# Patient Record
Sex: Male | Born: 1955 | Race: White | Hispanic: No | Marital: Single | Smoking: Never smoker
Health system: Southern US, Community
[De-identification: ages and names within clinical notes are randomized; demographics above are authoritative.]

## PROBLEM LIST (undated history)

## (undated) DIAGNOSIS — F32A Depression, unspecified: Secondary | ICD-10-CM

## (undated) DIAGNOSIS — G629 Polyneuropathy, unspecified: Secondary | ICD-10-CM

## (undated) DIAGNOSIS — K7460 Unspecified cirrhosis of liver: Secondary | ICD-10-CM

## (undated) DIAGNOSIS — E7800 Pure hypercholesterolemia, unspecified: Secondary | ICD-10-CM

## (undated) DIAGNOSIS — F1020 Alcohol dependence, uncomplicated: Secondary | ICD-10-CM

## (undated) DIAGNOSIS — E119 Type 2 diabetes mellitus without complications: Secondary | ICD-10-CM

## (undated) DIAGNOSIS — F329 Major depressive disorder, single episode, unspecified: Secondary | ICD-10-CM

## (undated) HISTORY — PX: SHOULDER SURGERY: SHX246

## (undated) HISTORY — PX: OTHER SURGICAL HISTORY: SHX169

## (undated) HISTORY — PX: LUMBAR SPINE SURGERY: SHX701

---

## 2015-08-22 ENCOUNTER — Encounter (HOSPITAL_COMMUNITY)
Admission: EM | Disposition: A | Discharge status: Home / Self Care | Payer: Self-pay | Source: Home / Self Care | Attending: Pulmonary Disease

## 2015-08-22 ENCOUNTER — Inpatient Hospital Stay (HOSPITAL_COMMUNITY)
Admission: EM | Admit: 2015-08-22 | Discharge: 2015-08-29 | DRG: 368 | Disposition: A | Discharge status: Home / Self Care | Payer: Self-pay | Attending: Internal Medicine | Admitting: Internal Medicine

## 2015-08-22 ENCOUNTER — Inpatient Hospital Stay (HOSPITAL_COMMUNITY): Payer: Self-pay

## 2015-08-22 ENCOUNTER — Encounter (HOSPITAL_COMMUNITY): Payer: Self-pay | Admitting: Emergency Medicine

## 2015-08-22 ENCOUNTER — Observation Stay (HOSPITAL_COMMUNITY): Payer: Self-pay

## 2015-08-22 DIAGNOSIS — F329 Major depressive disorder, single episode, unspecified: Secondary | ICD-10-CM | POA: Diagnosis present

## 2015-08-22 DIAGNOSIS — R000 Tachycardia, unspecified: Secondary | ICD-10-CM | POA: Diagnosis present

## 2015-08-22 DIAGNOSIS — D689 Coagulation defect, unspecified: Secondary | ICD-10-CM | POA: Diagnosis present

## 2015-08-22 DIAGNOSIS — K7460 Unspecified cirrhosis of liver: Secondary | ICD-10-CM | POA: Diagnosis present

## 2015-08-22 DIAGNOSIS — E669 Obesity, unspecified: Secondary | ICD-10-CM | POA: Diagnosis present

## 2015-08-22 DIAGNOSIS — R579 Shock, unspecified: Secondary | ICD-10-CM

## 2015-08-22 DIAGNOSIS — F10929 Alcohol use, unspecified with intoxication, unspecified: Secondary | ICD-10-CM

## 2015-08-22 DIAGNOSIS — Z9884 Bariatric surgery status: Secondary | ICD-10-CM

## 2015-08-22 DIAGNOSIS — Z6828 Body mass index (BMI) 28.0-28.9, adult: Secondary | ICD-10-CM

## 2015-08-22 DIAGNOSIS — Z01818 Encounter for other preprocedural examination: Secondary | ICD-10-CM

## 2015-08-22 DIAGNOSIS — Z794 Long term (current) use of insulin: Secondary | ICD-10-CM

## 2015-08-22 DIAGNOSIS — K709 Alcoholic liver disease, unspecified: Secondary | ICD-10-CM | POA: Diagnosis present

## 2015-08-22 DIAGNOSIS — R571 Hypovolemic shock: Secondary | ICD-10-CM | POA: Diagnosis present

## 2015-08-22 DIAGNOSIS — E876 Hypokalemia: Secondary | ICD-10-CM | POA: Diagnosis not present

## 2015-08-22 DIAGNOSIS — N170 Acute kidney failure with tubular necrosis: Secondary | ICD-10-CM | POA: Diagnosis present

## 2015-08-22 DIAGNOSIS — E875 Hyperkalemia: Secondary | ICD-10-CM | POA: Diagnosis present

## 2015-08-22 DIAGNOSIS — J9600 Acute respiratory failure, unspecified whether with hypoxia or hypercapnia: Secondary | ICD-10-CM

## 2015-08-22 DIAGNOSIS — I8500 Esophageal varices without bleeding: Secondary | ICD-10-CM | POA: Diagnosis present

## 2015-08-22 DIAGNOSIS — E872 Acidosis: Secondary | ICD-10-CM | POA: Diagnosis present

## 2015-08-22 DIAGNOSIS — E1310 Other specified diabetes mellitus with ketoacidosis without coma: Secondary | ICD-10-CM | POA: Diagnosis present

## 2015-08-22 DIAGNOSIS — F10239 Alcohol dependence with withdrawal, unspecified: Secondary | ICD-10-CM | POA: Diagnosis present

## 2015-08-22 DIAGNOSIS — R7881 Bacteremia: Secondary | ICD-10-CM

## 2015-08-22 DIAGNOSIS — F10920 Alcohol use, unspecified with intoxication, uncomplicated: Secondary | ICD-10-CM

## 2015-08-22 DIAGNOSIS — F32A Depression, unspecified: Secondary | ICD-10-CM | POA: Diagnosis present

## 2015-08-22 DIAGNOSIS — K922 Gastrointestinal hemorrhage, unspecified: Secondary | ICD-10-CM

## 2015-08-22 DIAGNOSIS — Y908 Blood alcohol level of 240 mg/100 ml or more: Secondary | ICD-10-CM | POA: Diagnosis present

## 2015-08-22 DIAGNOSIS — D62 Acute posthemorrhagic anemia: Secondary | ICD-10-CM | POA: Diagnosis present

## 2015-08-22 DIAGNOSIS — R7401 Elevation of levels of liver transaminase levels: Secondary | ICD-10-CM

## 2015-08-22 DIAGNOSIS — A419 Sepsis, unspecified organism: Secondary | ICD-10-CM

## 2015-08-22 DIAGNOSIS — R7989 Other specified abnormal findings of blood chemistry: Secondary | ICD-10-CM | POA: Diagnosis present

## 2015-08-22 DIAGNOSIS — K226 Gastro-esophageal laceration-hemorrhage syndrome: Principal | ICD-10-CM | POA: Diagnosis present

## 2015-08-22 DIAGNOSIS — E785 Hyperlipidemia, unspecified: Secondary | ICD-10-CM | POA: Diagnosis present

## 2015-08-22 DIAGNOSIS — R188 Other ascites: Secondary | ICD-10-CM | POA: Diagnosis present

## 2015-08-22 DIAGNOSIS — D696 Thrombocytopenia, unspecified: Secondary | ICD-10-CM | POA: Diagnosis present

## 2015-08-22 DIAGNOSIS — D6959 Other secondary thrombocytopenia: Secondary | ICD-10-CM | POA: Diagnosis present

## 2015-08-22 DIAGNOSIS — F1020 Alcohol dependence, uncomplicated: Secondary | ICD-10-CM | POA: Diagnosis present

## 2015-08-22 DIAGNOSIS — R748 Abnormal levels of other serum enzymes: Secondary | ICD-10-CM

## 2015-08-22 DIAGNOSIS — E7800 Pure hypercholesterolemia, unspecified: Secondary | ICD-10-CM

## 2015-08-22 DIAGNOSIS — R578 Other shock: Secondary | ICD-10-CM | POA: Diagnosis present

## 2015-08-22 DIAGNOSIS — E1110 Type 2 diabetes mellitus with ketoacidosis without coma: Secondary | ICD-10-CM

## 2015-08-22 DIAGNOSIS — F10220 Alcohol dependence with intoxication, uncomplicated: Secondary | ICD-10-CM | POA: Diagnosis present

## 2015-08-22 DIAGNOSIS — E119 Type 2 diabetes mellitus without complications: Secondary | ICD-10-CM

## 2015-08-22 DIAGNOSIS — A499 Bacterial infection, unspecified: Secondary | ICD-10-CM

## 2015-08-22 DIAGNOSIS — K7200 Acute and subacute hepatic failure without coma: Secondary | ICD-10-CM | POA: Diagnosis present

## 2015-08-22 DIAGNOSIS — E8770 Fluid overload, unspecified: Secondary | ICD-10-CM

## 2015-08-22 DIAGNOSIS — K766 Portal hypertension: Secondary | ICD-10-CM | POA: Diagnosis present

## 2015-08-22 DIAGNOSIS — K921 Melena: Secondary | ICD-10-CM

## 2015-08-22 DIAGNOSIS — G629 Polyneuropathy, unspecified: Secondary | ICD-10-CM

## 2015-08-22 DIAGNOSIS — Z79899 Other long term (current) drug therapy: Secondary | ICD-10-CM

## 2015-08-22 DIAGNOSIS — R740 Nonspecific elevation of levels of transaminase and lactic acid dehydrogenase [LDH]: Secondary | ICD-10-CM | POA: Diagnosis present

## 2015-08-22 HISTORY — DX: Alcohol dependence, uncomplicated: F10.20

## 2015-08-22 HISTORY — DX: Pure hypercholesterolemia, unspecified: E78.00

## 2015-08-22 HISTORY — DX: Major depressive disorder, single episode, unspecified: F32.9

## 2015-08-22 HISTORY — DX: Polyneuropathy, unspecified: G62.9

## 2015-08-22 HISTORY — DX: Depression, unspecified: F32.A

## 2015-08-22 HISTORY — PX: ESOPHAGOGASTRODUODENOSCOPY: SHX5428

## 2015-08-22 HISTORY — DX: Type 2 diabetes mellitus without complications: E11.9

## 2015-08-22 LAB — POCT I-STAT 3, ART BLOOD GAS (G3+)
ACID-BASE DEFICIT: 19 mmol/L — AB (ref 0.0–2.0)
ACID-BASE DEFICIT: 13 mmol/L — AB (ref 0.0–2.0)
Acid-base deficit: 16 mmol/L — ABNORMAL HIGH (ref 0.0–2.0)
Bicarbonate: 9.3 mEq/L — ABNORMAL LOW (ref 20.0–24.0)
Bicarbonate: 11.5 mEq/L — ABNORMAL LOW (ref 20.0–24.0)
Bicarbonate: 12.6 mEq/L — ABNORMAL LOW (ref 20.0–24.0)
O2 Saturation: 100 %
O2 Saturation: 99 %
O2 Saturation: 99 %
PCO2 ART: 33.5 mmHg — AB (ref 35.0–45.0)
PH ART: 7.145 — AB (ref 7.350–7.450)
PH ART: 7.278 — AB (ref 7.350–7.450)
TCO2: 10 mmol/L (ref 0–100)
TCO2: 13 mmol/L (ref 0–100)
TCO2: 13 mmol/L (ref 0–100)
pCO2 arterial: 30.2 mmHg — ABNORMAL LOW (ref 35.0–45.0)
pCO2 arterial: 27.2 mmHg — ABNORMAL LOW (ref 35.0–45.0)
pH, Arterial: 7.096 — CL (ref 7.350–7.450)
pO2, Arterial: 249 mmHg — ABNORMAL HIGH (ref 80.0–100.0)
pO2, Arterial: 165 mmHg — ABNORMAL HIGH (ref 80.0–100.0)
pO2, Arterial: 177 mmHg — ABNORMAL HIGH (ref 80.0–100.0)

## 2015-08-22 LAB — CBC
HCT: 20 % — ABNORMAL LOW (ref 39.0–52.0)
HCT: 23.3 % — ABNORMAL LOW (ref 39.0–52.0)
HCT: 28.8 % — ABNORMAL LOW (ref 39.0–52.0)
HEMATOCRIT: 28.3 % — AB (ref 39.0–52.0)
HEMATOCRIT: 25.6 % — AB (ref 39.0–52.0)
HEMOGLOBIN: 6.3 g/dL — AB (ref 13.0–17.0)
HEMOGLOBIN: 7.9 g/dL — AB (ref 13.0–17.0)
Hemoglobin: 7.1 g/dL — ABNORMAL LOW (ref 13.0–17.0)
Hemoglobin: 8.7 g/dL — ABNORMAL LOW (ref 13.0–17.0)
Hemoglobin: 8.7 g/dL — ABNORMAL LOW (ref 13.0–17.0)
MCH: 27.5 pg (ref 26.0–34.0)
MCH: 27.3 pg (ref 26.0–34.0)
MCH: 27.3 pg (ref 26.0–34.0)
MCH: 27.6 pg (ref 26.0–34.0)
MCH: 27.1 pg (ref 26.0–34.0)
MCHC: 31.5 g/dL (ref 30.0–36.0)
MCHC: 30.5 g/dL (ref 30.0–36.0)
MCHC: 30.2 g/dL (ref 30.0–36.0)
MCHC: 30.7 g/dL (ref 30.0–36.0)
MCHC: 30.9 g/dL (ref 30.0–36.0)
MCV: 87.3 fL (ref 78.0–100.0)
MCV: 89.6 fL (ref 78.0–100.0)
MCV: 90.3 fL (ref 78.0–100.0)
MCV: 89.8 fL (ref 78.0–100.0)
MCV: 87.7 fL (ref 78.0–100.0)
PLATELETS: 73 10*3/uL — AB (ref 150–400)
PLATELETS: 69 10*3/uL — AB (ref 150–400)
PLATELETS: 79 10*3/uL — AB (ref 150–400)
PLATELETS: 80 10*3/uL — AB (ref 150–400)
Platelets: 74 10*3/uL — ABNORMAL LOW (ref 150–400)
RBC: 2.29 MIL/uL — AB (ref 4.22–5.81)
RBC: 2.6 MIL/uL — ABNORMAL LOW (ref 4.22–5.81)
RBC: 3.19 MIL/uL — AB (ref 4.22–5.81)
RBC: 3.15 MIL/uL — AB (ref 4.22–5.81)
RBC: 2.92 MIL/uL — ABNORMAL LOW (ref 4.22–5.81)
RDW: 17.7 % — ABNORMAL HIGH (ref 11.5–15.5)
RDW: 16.8 % — AB (ref 11.5–15.5)
RDW: 15.6 % — ABNORMAL HIGH (ref 11.5–15.5)
RDW: 16.1 % — AB (ref 11.5–15.5)
RDW: 16.5 % — ABNORMAL HIGH (ref 11.5–15.5)
WBC: 5.1 10*3/uL (ref 4.0–10.5)
WBC: 7.3 10*3/uL (ref 4.0–10.5)
WBC: 9.3 10*3/uL (ref 4.0–10.5)
WBC: 14.2 10*3/uL — AB (ref 4.0–10.5)
WBC: 13.1 10*3/uL — AB (ref 4.0–10.5)

## 2015-08-22 LAB — COMPREHENSIVE METABOLIC PANEL
ALK PHOS: 107 U/L (ref 38–126)
ALK PHOS: 84 U/L (ref 38–126)
ALT: 93 U/L — ABNORMAL HIGH (ref 17–63)
ALT: 114 U/L — ABNORMAL HIGH (ref 17–63)
ALT: 282 U/L — AB (ref 17–63)
ANION GAP: 22 — AB (ref 5–15)
ANION GAP: 22 — AB (ref 5–15)
AST: 240 U/L — ABNORMAL HIGH (ref 15–41)
AST: 304 U/L — AB (ref 15–41)
AST: 763 U/L — AB (ref 15–41)
Albumin: 3.5 g/dL (ref 3.5–5.0)
Albumin: 3.4 g/dL — ABNORMAL LOW (ref 3.5–5.0)
Albumin: 2.9 g/dL — ABNORMAL LOW (ref 3.5–5.0)
Alkaline Phosphatase: 99 U/L (ref 38–126)
Anion gap: 26 — ABNORMAL HIGH (ref 5–15)
BILIRUBIN TOTAL: 2.5 mg/dL — AB (ref 0.3–1.2)
BILIRUBIN TOTAL: 2.6 mg/dL — AB (ref 0.3–1.2)
BUN: 15 mg/dL (ref 6–20)
BUN: 15 mg/dL (ref 6–20)
BUN: 17 mg/dL (ref 6–20)
CALCIUM: 8.3 mg/dL — AB (ref 8.9–10.3)
CALCIUM: 7.2 mg/dL — AB (ref 8.9–10.3)
CHLORIDE: 102 mmol/L (ref 101–111)
CO2: 19 mmol/L — ABNORMAL LOW (ref 22–32)
CO2: 15 mmol/L — AB (ref 22–32)
CO2: 12 mmol/L — AB (ref 22–32)
Calcium: 8.1 mg/dL — ABNORMAL LOW (ref 8.9–10.3)
Chloride: 101 mmol/L (ref 101–111)
Chloride: 110 mmol/L (ref 101–111)
Creatinine, Ser: 1.13 mg/dL (ref 0.61–1.24)
Creatinine, Ser: 1.03 mg/dL (ref 0.61–1.24)
Creatinine, Ser: 1.02 mg/dL (ref 0.61–1.24)
GFR calc Af Amer: >60 mL/min (ref >60)
GLUCOSE: 166 mg/dL — AB (ref 65–99)
Glucose, Bld: 149 mg/dL — ABNORMAL HIGH (ref 65–99)
Glucose, Bld: 163 mg/dL — ABNORMAL HIGH (ref 65–99)
POTASSIUM: 4.5 mmol/L (ref 3.5–5.1)
POTASSIUM: 4.6 mmol/L (ref 3.5–5.1)
Potassium: 5.6 mmol/L — ABNORMAL HIGH (ref 3.5–5.1)
SODIUM: 143 mmol/L (ref 135–145)
Sodium: 142 mmol/L (ref 135–145)
Sodium: 144 mmol/L (ref 135–145)
TOTAL PROTEIN: 6.5 g/dL (ref 6.5–8.1)
Total Bilirubin: 2.4 mg/dL — ABNORMAL HIGH (ref 0.3–1.2)
Total Protein: 6.3 g/dL — ABNORMAL LOW (ref 6.5–8.1)
Total Protein: 5.6 g/dL — ABNORMAL LOW (ref 6.5–8.1)

## 2015-08-22 LAB — BASIC METABOLIC PANEL
Anion gap: 20 — ABNORMAL HIGH (ref 5–15)
BUN: 17 mg/dL (ref 6–20)
CHLORIDE: 111 mmol/L (ref 101–111)
CO2: 14 mmol/L — AB (ref 22–32)
Calcium: 7.2 mg/dL — ABNORMAL LOW (ref 8.9–10.3)
Creatinine, Ser: 0.99 mg/dL (ref 0.61–1.24)
GFR calc Af Amer: >60 mL/min (ref >60)
GFR calc non Af Amer: >60 mL/min (ref >60)
GLUCOSE: 161 mg/dL — AB (ref 65–99)
POTASSIUM: 5.7 mmol/L — AB (ref 3.5–5.1)
Sodium: 145 mmol/L (ref 135–145)

## 2015-08-22 LAB — I-STAT CG4 LACTIC ACID, ED: LACTIC ACID, VENOUS: 8 mmol/L — AB (ref 0.5–1.9)

## 2015-08-22 LAB — PATHOLOGIST SMEAR REVIEW

## 2015-08-22 LAB — CBC WITH DIFFERENTIAL/PLATELET
BASOS PCT: 0 %
Basophils Absolute: 0 10*3/uL (ref 0.0–0.1)
Basophils Absolute: 0 10*3/uL (ref 0.0–0.1)
Basophils Relative: 1 %
EOS PCT: 0 %
EOS PCT: 0 %
Eosinophils Absolute: 0 10*3/uL (ref 0.0–0.7)
Eosinophils Absolute: 0 10*3/uL (ref 0.0–0.7)
HEMATOCRIT: 25.7 % — AB (ref 39.0–52.0)
HEMATOCRIT: 19.3 % — AB (ref 39.0–52.0)
HEMOGLOBIN: 8.1 g/dL — AB (ref 13.0–17.0)
HEMOGLOBIN: 5.8 g/dL — AB (ref 13.0–17.0)
LYMPHS ABS: 1.2 10*3/uL (ref 0.7–4.0)
LYMPHS PCT: 11 %
Lymphocytes Relative: 28 %
Lymphs Abs: 0.5 10*3/uL — ABNORMAL LOW (ref 0.7–4.0)
MCH: 27.3 pg (ref 26.0–34.0)
MCH: 26.7 pg (ref 26.0–34.0)
MCHC: 31.5 g/dL (ref 30.0–36.0)
MCHC: 30.1 g/dL (ref 30.0–36.0)
MCV: 86.5 fL (ref 78.0–100.0)
MCV: 88.9 fL (ref 78.0–100.0)
MONO ABS: 0.3 10*3/uL (ref 0.1–1.0)
MONOS PCT: 7 %
MONOS PCT: 10 %
Monocytes Absolute: 0.5 10*3/uL (ref 0.1–1.0)
NEUTROS ABS: 2.8 10*3/uL (ref 1.7–7.7)
NEUTROS PCT: 79 %
Neutro Abs: 3.5 10*3/uL (ref 1.7–7.7)
Neutrophils Relative %: 64 %
Platelets: 73 10*3/uL — ABNORMAL LOW (ref 150–400)
Platelets: 61 10*3/uL — ABNORMAL LOW (ref 150–400)
RBC: 2.97 MIL/uL — AB (ref 4.22–5.81)
RBC: 2.17 MIL/uL — AB (ref 4.22–5.81)
RDW: 17.5 % — AB (ref 11.5–15.5)
RDW: 17.7 % — ABNORMAL HIGH (ref 11.5–15.5)
WBC: 4.3 10*3/uL (ref 4.0–10.5)
WBC: 4.5 10*3/uL (ref 4.0–10.5)

## 2015-08-22 LAB — BLOOD CULTURE ID PANEL (REFLEXED)
ACINETOBACTER BAUMANNII: NOT DETECTED
CANDIDA KRUSEI: NOT DETECTED
CARBAPENEM RESISTANCE: NOT DETECTED
Candida albicans: NOT DETECTED
Candida glabrata: NOT DETECTED
Candida parapsilosis: NOT DETECTED
Candida tropicalis: NOT DETECTED
ENTEROBACTERIACEAE SPECIES: DETECTED — AB
ESCHERICHIA COLI: DETECTED — AB
Enterobacter cloacae complex: NOT DETECTED
Enterococcus species: DETECTED — AB
Haemophilus influenzae: NOT DETECTED
KLEBSIELLA OXYTOCA: NOT DETECTED
Klebsiella pneumoniae: DETECTED — AB
Listeria monocytogenes: NOT DETECTED
Methicillin resistance: NOT DETECTED
NEISSERIA MENINGITIDIS: NOT DETECTED
PSEUDOMONAS AERUGINOSA: NOT DETECTED
Proteus species: NOT DETECTED
STAPHYLOCOCCUS AUREUS BCID: NOT DETECTED
STAPHYLOCOCCUS SPECIES: NOT DETECTED
STREPTOCOCCUS AGALACTIAE: NOT DETECTED
STREPTOCOCCUS SPECIES: NOT DETECTED
Serratia marcescens: NOT DETECTED
Streptococcus pneumoniae: NOT DETECTED
Streptococcus pyogenes: NOT DETECTED
Vancomycin resistance: NOT DETECTED

## 2015-08-22 LAB — GLUCOSE, CAPILLARY
GLUCOSE-CAPILLARY: 142 mg/dL — AB (ref 65–99)
GLUCOSE-CAPILLARY: 151 mg/dL — AB (ref 65–99)
Glucose-Capillary: 189 mg/dL — ABNORMAL HIGH (ref 65–99)
Glucose-Capillary: 202 mg/dL — ABNORMAL HIGH (ref 65–99)

## 2015-08-22 LAB — PREPARE RBC (CROSSMATCH)

## 2015-08-22 LAB — LACTIC ACID, PLASMA
LACTIC ACID, VENOUS: 8.8 mmol/L — AB (ref 0.5–1.9)
LACTIC ACID, VENOUS: 9.8 mmol/L — AB (ref 0.5–1.9)
Lactic Acid, Venous: 9.6 mmol/L (ref 0.5–1.9)

## 2015-08-22 LAB — TRANSFUSION REACTION
DAT C3: NEGATIVE
POST RXN DAT IGG: NEGATIVE

## 2015-08-22 LAB — PROTIME-INR
INR: 1.36
INR: 1.8
PROTHROMBIN TIME: 16.9 s — AB (ref 11.4–15.2)
Prothrombin Time: 21.1 seconds — ABNORMAL HIGH (ref 11.4–15.2)

## 2015-08-22 LAB — ETHANOL: ALCOHOL ETHYL (B): 316 mg/dL — AB (ref <5)

## 2015-08-22 LAB — PHOSPHORUS: PHOSPHORUS: 5.4 mg/dL — AB (ref 2.5–4.6)

## 2015-08-22 LAB — CORTISOL: Cortisol, Plasma: 37.2 ug/dL

## 2015-08-22 LAB — ABO/RH: ABO/RH(D): O POS

## 2015-08-22 LAB — PROCALCITONIN: PROCALCITONIN: 0.18 ng/mL

## 2015-08-22 LAB — MRSA PCR SCREENING: MRSA by PCR: NEGATIVE

## 2015-08-22 LAB — MAGNESIUM: MAGNESIUM: 1.5 mg/dL — AB (ref 1.7–2.4)

## 2015-08-22 LAB — APTT: APTT: 28 s (ref 24–36)

## 2015-08-22 LAB — LIPASE, BLOOD: Lipase: 42 U/L (ref 11–51)

## 2015-08-22 LAB — AMYLASE: AMYLASE: 38 U/L (ref 28–100)

## 2015-08-22 LAB — TROPONIN I: Troponin I: <0.03 ng/mL (ref <0.03)

## 2015-08-22 LAB — D-DIMER, QUANTITATIVE (NOT AT ARMC): D DIMER QUANT: 3.29 ug{FEU}/mL — AB (ref 0.00–0.50)

## 2015-08-22 LAB — SAMPLE TO BLOOD BANK

## 2015-08-22 LAB — BRAIN NATRIURETIC PEPTIDE: B Natriuretic Peptide: 14.7 pg/mL (ref 0.0–100.0)

## 2015-08-22 SURGERY — EGD (ESOPHAGOGASTRODUODENOSCOPY)
Anesthesia: Moderate Sedation | Laterality: Left

## 2015-08-22 MED ORDER — SODIUM CHLORIDE 0.9 % IV SOLN: 8.0000 mg/h | INTRAVENOUS | Status: DC

## 2015-08-22 MED ORDER — SODIUM CHLORIDE 0.9 % IV SOLN: Freq: Once | INTRAVENOUS | Status: DC

## 2015-08-22 MED ORDER — VITAMIN B-1 100 MG PO TABS
100.0000 mg | ORAL_TABLET | Freq: Every day | ORAL | Status: DC
  Administered 2015-08-25 – 2015-08-29 (×5): 100 mg via ORAL
  Filled 2015-08-22 (×9): qty 1

## 2015-08-22 MED ORDER — MIDAZOLAM HCL 2 MG/2ML IJ SOLN
2.0000 mg | INTRAMUSCULAR | Status: DC | PRN
  Administered 2015-08-22 – 2015-08-23 (×6): 2 mg via INTRAVENOUS
  Filled 2015-08-22 (×8): qty 2

## 2015-08-22 MED ORDER — NOREPINEPHRINE BITARTRATE 1 MG/ML IV SOLN
2.0000 ug/min | INTRAVENOUS | Status: DC
  Administered 2015-08-22: 45 ug/min via INTRAVENOUS
  Administered 2015-08-22: 5 ug/min via INTRAVENOUS
  Administered 2015-08-23: 30 ug/min via INTRAVENOUS
  Administered 2015-08-23: 35 ug/min via INTRAVENOUS
  Filled 2015-08-22 (×5): qty 16

## 2015-08-22 MED ORDER — FENTANYL CITRATE (PF) 100 MCG/2ML IJ SOLN
INTRAMUSCULAR | Status: AC
  Administered 2015-08-22: 100 ug
  Filled 2015-08-22: qty 4

## 2015-08-22 MED ORDER — METOCLOPRAMIDE HCL 5 MG/ML IJ SOLN
10.0000 mg | Freq: Once | INTRAMUSCULAR | Status: AC
  Administered 2015-08-22: 10 mg via INTRAVENOUS
  Filled 2015-08-22: qty 2

## 2015-08-22 MED ORDER — LORAZEPAM 2 MG/ML IJ SOLN: 0.0000 mg | Freq: Two times a day (BID) | INTRAMUSCULAR | Status: DC

## 2015-08-22 MED ORDER — SIMVASTATIN 20 MG PO TABS: 20.0000 mg | ORAL_TABLET | Freq: Every day | ORAL | Status: DC

## 2015-08-22 MED ORDER — INSULIN ASPART PROT & ASPART (70-30 MIX) 100 UNIT/ML ~~LOC~~ SUSP: 6.0000 [IU] | Freq: Two times a day (BID) | SUBCUTANEOUS | Status: DC

## 2015-08-22 MED ORDER — SODIUM CHLORIDE 0.9 % IV SOLN
INTRAVENOUS | Status: DC
  Administered 2015-08-22: 11:00:00 via INTRAVENOUS

## 2015-08-22 MED ORDER — SODIUM CHLORIDE 0.9% FLUSH
3.0000 mL | Freq: Two times a day (BID) | INTRAVENOUS | Status: DC
  Administered 2015-08-23 – 2015-08-29 (×10): 3 mL via INTRAVENOUS

## 2015-08-22 MED ORDER — SODIUM BICARBONATE 8.4 % IV SOLN
50.0000 meq | Freq: Once | INTRAVENOUS | Status: AC
  Administered 2015-08-22: 50 meq via INTRAVENOUS
  Filled 2015-08-22: qty 50

## 2015-08-22 MED ORDER — SODIUM CHLORIDE 0.9 % IV SOLN
80.0000 mg | Freq: Once | INTRAVENOUS | Status: AC
  Administered 2015-08-22: 80 mg via INTRAVENOUS
  Filled 2015-08-22: qty 80

## 2015-08-22 MED ORDER — BUSPIRONE HCL 5 MG PO TABS
5.0000 mg | ORAL_TABLET | Freq: Two times a day (BID) | ORAL | Status: DC
  Filled 2015-08-22: qty 1

## 2015-08-22 MED ORDER — VITAMIN K1 10 MG/ML IJ SOLN
10.0000 mg | Freq: Once | INTRAMUSCULAR | Status: AC
  Administered 2015-08-22: 10 mg via INTRAVENOUS
  Filled 2015-08-22: qty 1

## 2015-08-22 MED ORDER — SODIUM CHLORIDE 0.9 % IV SOLN: 80.0000 mg | Freq: Once | INTRAVENOUS | Status: DC

## 2015-08-22 MED ORDER — SODIUM CHLORIDE 0.9 % IV SOLN: INTRAVENOUS | Status: DC

## 2015-08-22 MED ORDER — SODIUM CHLORIDE 0.9 % IV SOLN
1000.0000 mL | INTRAVENOUS | Status: DC
  Administered 2015-08-22: 1000 mL via INTRAVENOUS

## 2015-08-22 MED ORDER — SODIUM CHLORIDE 0.9 % IJ SOLN
INTRAMUSCULAR | Status: DC | PRN
  Administered 2015-08-22: 4 mL

## 2015-08-22 MED ORDER — OCTREOTIDE LOAD VIA INFUSION: 50.0000 ug | Freq: Once | INTRAVENOUS | Status: DC

## 2015-08-22 MED ORDER — FENTANYL BOLUS VIA INFUSION
50.0000 ug | INTRAVENOUS | Status: DC | PRN
  Filled 2015-08-22: qty 50

## 2015-08-22 MED ORDER — THIAMINE HCL 100 MG/ML IJ SOLN
100.0000 mg | Freq: Every day | INTRAMUSCULAR | Status: DC
  Administered 2015-08-22 – 2015-08-24 (×3): 100 mg via INTRAVENOUS
  Filled 2015-08-22 (×4): qty 2

## 2015-08-22 MED ORDER — MIDAZOLAM HCL 2 MG/2ML IJ SOLN
INTRAMUSCULAR | Status: AC
  Administered 2015-08-22: 2 mg via INTRAVENOUS
  Filled 2015-08-22: qty 2

## 2015-08-22 MED ORDER — NOREPINEPHRINE BITARTRATE 1 MG/ML IV SOLN: 2.0000 ug/min | INTRAVENOUS | Status: DC

## 2015-08-22 MED ORDER — ONDANSETRON HCL 4 MG/2ML IJ SOLN: 4.0000 mg | Freq: Four times a day (QID) | INTRAMUSCULAR | Status: DC | PRN

## 2015-08-22 MED ORDER — EPINEPHRINE HCL 0.1 MG/ML IJ SOSY
PREFILLED_SYRINGE | INTRAMUSCULAR | Status: AC
  Filled 2015-08-22: qty 20

## 2015-08-22 MED ORDER — CHLORHEXIDINE GLUCONATE 0.12% ORAL RINSE (MEDLINE KIT)
15.0000 mL | Freq: Two times a day (BID) | OROMUCOSAL | Status: DC
  Administered 2015-08-22 – 2015-08-25 (×6): 15 mL via OROMUCOSAL

## 2015-08-22 MED ORDER — INSULIN ASPART 100 UNIT/ML ~~LOC~~ SOLN: 0.0000 [IU] | Freq: Three times a day (TID) | SUBCUTANEOUS | Status: DC

## 2015-08-22 MED ORDER — SODIUM CHLORIDE 0.9 % IV SOLN
50.0000 ug/h | INTRAVENOUS | Status: DC
  Administered 2015-08-22 – 2015-08-24 (×6): 50 ug/h via INTRAVENOUS
  Filled 2015-08-22 (×9): qty 1

## 2015-08-22 MED ORDER — PANTOPRAZOLE SODIUM 40 MG IV SOLR
40.0000 mg | Freq: Two times a day (BID) | INTRAVENOUS | Status: DC
  Administered 2015-08-25 – 2015-08-29 (×9): 40 mg via INTRAVENOUS
  Filled 2015-08-22 (×10): qty 40

## 2015-08-22 MED ORDER — PANTOPRAZOLE SODIUM 40 MG IV SOLR: 40.0000 mg | Freq: Two times a day (BID) | INTRAVENOUS | Status: DC

## 2015-08-22 MED ORDER — SODIUM CHLORIDE 0.9 % IV BOLUS (SEPSIS)
1000.0000 mL | Freq: Once | INTRAVENOUS | Status: AC
  Administered 2015-08-22: 1000 mL via INTRAVENOUS

## 2015-08-22 MED ORDER — SODIUM CHLORIDE 0.9 % IV SOLN: 250.0000 mL | INTRAVENOUS | Status: DC | PRN

## 2015-08-22 MED ORDER — CEFTRIAXONE SODIUM 2 G IJ SOLR
2.0000 g | INTRAMUSCULAR | Status: DC
  Filled 2015-08-22: qty 2

## 2015-08-22 MED ORDER — CITALOPRAM HYDROBROMIDE 20 MG PO TABS
20.0000 mg | ORAL_TABLET | Freq: Every day | ORAL | Status: DC
  Filled 2015-08-22: qty 1

## 2015-08-22 MED ORDER — LORAZEPAM 2 MG/ML IJ SOLN
0.0000 mg | INTRAMUSCULAR | Status: DC
  Administered 2015-08-22: 2 mg via INTRAVENOUS
  Filled 2015-08-22: qty 1

## 2015-08-22 MED ORDER — ANTISEPTIC ORAL RINSE SOLUTION (CORINZ)
7.0000 mL | Freq: Four times a day (QID) | OROMUCOSAL | Status: DC
  Administered 2015-08-22 – 2015-08-24 (×10): 7 mL via OROMUCOSAL

## 2015-08-22 MED ORDER — FENTANYL CITRATE (PF) 100 MCG/2ML IJ SOLN: 50.0000 ug | Freq: Once | INTRAMUSCULAR | Status: DC

## 2015-08-22 MED ORDER — ONDANSETRON HCL 4 MG PO TABS: 4.0000 mg | ORAL_TABLET | Freq: Four times a day (QID) | ORAL | Status: DC | PRN

## 2015-08-22 MED ORDER — LORAZEPAM 2 MG/ML IJ SOLN
0.0000 mg | Freq: Four times a day (QID) | INTRAMUSCULAR | Status: DC
  Administered 2015-08-22: 1 mg via INTRAVENOUS
  Filled 2015-08-22: qty 1

## 2015-08-22 MED ORDER — ONDANSETRON HCL 4 MG/2ML IJ SOLN
4.0000 mg | Freq: Once | INTRAMUSCULAR | Status: AC
  Administered 2015-08-22: 4 mg via INTRAVENOUS
  Filled 2015-08-22: qty 2

## 2015-08-22 MED ORDER — STERILE WATER FOR INJECTION IV SOLN
INTRAVENOUS | Status: DC
  Administered 2015-08-22 (×2): via INTRAVENOUS
  Filled 2015-08-22 (×5): qty 850

## 2015-08-22 MED ORDER — INSULIN ASPART 100 UNIT/ML ~~LOC~~ SOLN
0.0000 [IU] | SUBCUTANEOUS | Status: DC
  Administered 2015-08-22: 3 [IU] via SUBCUTANEOUS
  Administered 2015-08-22: 5 [IU] via SUBCUTANEOUS
  Administered 2015-08-23: 3 [IU] via SUBCUTANEOUS
  Administered 2015-08-23 (×2): 5 [IU] via SUBCUTANEOUS
  Administered 2015-08-23 (×2): 3 [IU] via SUBCUTANEOUS
  Administered 2015-08-23: 5 [IU] via SUBCUTANEOUS
  Administered 2015-08-24 (×2): 3 [IU] via SUBCUTANEOUS

## 2015-08-22 MED ORDER — DEXTROSE 5 % IV SOLN: 2.0000 g | INTRAVENOUS | Status: DC

## 2015-08-22 MED ORDER — VANCOMYCIN HCL 10 G IV SOLR
2000.0000 mg | Freq: Once | INTRAVENOUS | Status: AC
  Administered 2015-08-23: 2000 mg via INTRAVENOUS
  Filled 2015-08-22: qty 2000

## 2015-08-22 MED ORDER — SODIUM CHLORIDE 0.9 % IV SOLN
25.0000 ug/h | INTRAVENOUS | Status: DC
  Filled 2015-08-22: qty 1

## 2015-08-22 MED ORDER — GABAPENTIN 300 MG PO CAPS
300.0000 mg | ORAL_CAPSULE | Freq: Three times a day (TID) | ORAL | Status: DC
  Filled 2015-08-22: qty 1

## 2015-08-22 MED ORDER — VANCOMYCIN HCL IN DEXTROSE 1-5 GM/200ML-% IV SOLN
1000.0000 mg | Freq: Three times a day (TID) | INTRAVENOUS | Status: DC
  Administered 2015-08-23: 1000 mg via INTRAVENOUS
  Filled 2015-08-22 (×2): qty 200

## 2015-08-22 MED ORDER — MIDAZOLAM HCL 2 MG/2ML IJ SOLN
2.0000 mg | INTRAMUSCULAR | Status: DC | PRN
  Administered 2015-08-22 (×2): 2 mg via INTRAVENOUS
  Filled 2015-08-22: qty 2

## 2015-08-22 MED ORDER — SODIUM CHLORIDE 0.9 % IV SOLN
1000.0000 mL | Freq: Once | INTRAVENOUS | Status: AC
  Administered 2015-08-22: 1000 mL via INTRAVENOUS

## 2015-08-22 MED ORDER — PANTOPRAZOLE SODIUM 40 MG IV SOLR
8.0000 mg/h | INTRAVENOUS | Status: AC
  Administered 2015-08-22 – 2015-08-25 (×7): 8 mg/h via INTRAVENOUS
  Filled 2015-08-22 (×14): qty 80

## 2015-08-22 MED ORDER — FOLIC ACID 5 MG/ML IJ SOLN
1.0000 mg | Freq: Every day | INTRAMUSCULAR | Status: DC
  Administered 2015-08-22 – 2015-08-24 (×3): 1 mg via INTRAVENOUS
  Filled 2015-08-22 (×6): qty 0.2

## 2015-08-22 MED ORDER — VASOPRESSIN 20 UNIT/ML IV SOLN
0.0300 [IU]/min | INTRAVENOUS | Status: DC
  Administered 2015-08-22 – 2015-08-23 (×2): 0.03 [IU]/min via INTRAVENOUS
  Filled 2015-08-22 (×2): qty 2

## 2015-08-22 MED ORDER — FENTANYL CITRATE (PF) 2500 MCG/50ML IJ SOLN
25.0000 ug/h | INTRAMUSCULAR | Status: DC
  Administered 2015-08-22: 250 ug/h via INTRAVENOUS
  Administered 2015-08-22 – 2015-08-23 (×3): 350 ug/h via INTRAVENOUS
  Administered 2015-08-23: 100 ug/h via INTRAVENOUS
  Administered 2015-08-24: 200 ug/h via INTRAVENOUS
  Filled 2015-08-22 (×6): qty 50

## 2015-08-22 MED ORDER — DEXTROSE 5 % IV SOLN
2.0000 g | INTRAVENOUS | Status: AC
  Administered 2015-08-23: 2 g via INTRAVENOUS
  Filled 2015-08-22: qty 2

## 2015-08-22 MED ORDER — OCTREOTIDE LOAD VIA INFUSION
50.0000 ug | Freq: Once | INTRAVENOUS | Status: AC
  Administered 2015-08-22: 50 ug via INTRAVENOUS
  Filled 2015-08-22: qty 25

## 2015-08-22 NOTE — Progress Notes (Signed)
ABG off schedule due to patient not being intubated until 1045. I came to obtain ABG at 1200, but patient in procedure. Will obtain when then are done.

## 2015-08-22 NOTE — Op Note (Signed)
[facility] Patient Name: Bradley Harris Procedure Date : 08/22/2015 MRN: [id number] Attending MD: Shirley Friar , MD Date of Birth: 05/23/1955 CSN: [id number] Age: 60 Admit Type: Inpatient Procedure:                Upper GI endoscopy Indications:               Providers:                Shirley Friar, MD, Michel Bickers, RN,                            Kandice Robinsons, Technician Referring MD:              Medicines:                 Complications:             Estimated Blood Loss:      Procedure:                After obtaining informed consent, the endoscope was                            passed under direct vision. Throughout the                            procedure, the patient's blood pressure, pulse, and                            oxygen saturations were monitored continuously. The                            was introduced through the mouth, and advanced to                            the. Scope In: Scope Out: Findings:                  Impression:                Recommendation:            Procedure Code(s):         Diagnosis Code(s):         CPT copyright 2016 American Medical Association. All rights reserved. The codes documented in this report are preliminary and upon coder review may  be revised to meet current compliance requirements. Shirley Friar, MD Number of Addenda: 0

## 2015-08-22 NOTE — Brief Op Note (Signed)
Actively bleeding Mallory-Weiss tear with large amount of blood in gastric pouch. Large esophageal varices seen without bleeding stigmata. Hemostasis achieved with bicap gold probe cautery. If rebleeding occurs, then needs IR for embolization or surgery. D/W Dr. Corliss Skains and Dr. Tyson Alias. Continue Octreotide drip and Protonix drip. Keep intubated for airway protection.

## 2015-08-22 NOTE — Care Management Note (Signed)
Case Management Note  Patient Details  Name: Bradley Harris MRN: [id number] Date of Birth: [birth year]  Subjective/Objective:   Pt admitted with GIB                 Action/Plan:  PTA from home.  CSW consulted for current substance abuse.  Pt is now ventilated   Expected Discharge Date:                  Expected Discharge Plan:  Home/Self Care  In-House Referral:  Clinical Social Work  Discharge planning Services     Post Acute Care Choice:    Choice offered to:     DME Arranged:    DME Agency:     HH Arranged:    HH Agency:     Status of Service:  In process, will continue to follow  If discussed at Long Length of Stay Meetings, dates discussed:    Additional Comments:  Cherylann Parr, RN 08/22/2015, 11:21 AM

## 2015-08-22 NOTE — Progress Notes (Signed)
eLink Physician-Brief Progress Note Patient Name: Bradley Harris DOB: 1955-03-30 MRN: [id number]   Date of Service  08/22/2015  HPI/Events of Note  ABG    Component Value Date/Time   PHART 7.145 (LL) 08/22/2015 1828   PCO2ART 33.5 (L) 08/22/2015 1828   PO2ART 165.0 (H) 08/22/2015 1828   HCO3 11.5 (L) 08/22/2015 1828   TCO2 13 08/22/2015 1828   ACIDBASEDEF 16.0 (H) 08/22/2015 1828   O2SAT 99.0 08/22/2015 1828    Already on HCO3 gtt.   eICU Interventions  Increase RR from 18 to 24 and f/u ABG.      Intervention Category Major Interventions: Other:  Verita Kuroda 08/22/2015, 6:35 PM

## 2015-08-22 NOTE — ED Notes (Signed)
Report given to Lindsey, RN.

## 2015-08-22 NOTE — Progress Notes (Signed)
CRITICAL VALUE ALERT  Critical value received:  Lactic Acid 9.6  Date of notification:  08/22/15  Time of notification:  0945  Critical value read back:Yes.    Nurse who received alert:  Lorin Picket  MD notified (1st page):  Vassie Loll  Time of first page:  0945  MD notified (2nd page):  Time of second page:  Responding MD:  Vassie Loll  Time MD responded:  [phone]

## 2015-08-22 NOTE — Progress Notes (Signed)
CRITICAL VALUE ALERT  Critical value received:  Lactic acid9.8  Date of notification:  08/22/15  Time of notification:  1439  Critical value read back:Yes.    Nurse who received alert:  Lorin Picket  MD notified (1st page):  Anders Simmonds  Time of first page:  1455  MD notified (2nd page):  Time of second page:  Responding MD:  Tanja Port  Time MD responded:  1455

## 2015-08-22 NOTE — Progress Notes (Signed)
2 units of PRBC transfused emergently during patients endoscopy do to active bleeding. 1st unit: started at 1230; ended at 1252 2nd unit: Started at 1253; ended at 1318 No complications. Will continue to monitor closely

## 2015-08-22 NOTE — Progress Notes (Signed)
PULMONARY / CRITICAL CARE MEDICINE   Name: Bradley Harris MRN: [id number] DOB: 01/14/1956    ADMISSION DATE:  08/22/2015 CONSULTATION DATE:  08/22/15  REFERRING MD:  Bradley Harris  CHIEF COMPLAINT:  Upper GI bleed  HISTORY OF PRESENT ILLNESS:   Mr. Bradley Harris is a 60 year old with history of alcoholism, diabetes mellitus, depression, hyperlipidemia. He is admitted today with hematemesis and melena for the past 2 days. He drinks a  bottle of bourbon every day. In the ED he was found to have tachycardia, hypertension which improved after 2 L normal saline. He has H/O ascites that was tapped a few years ago. No known varices, he has never had a EGD.  ->He was initially admitted to [facility] and [facility]. However while getting his first unit of blood he stood up and became tachycardic to the 160s. He continues to have hematemesis and melena. PCCM called for admission due to hemodynamic instability. SUBJECTIVE:  Feels sleepy, just got ativan. He is now hypotensive.  VITAL SIGNS: BP (!) 67/42   Pulse 117   Temp 98.5 F (36.9 C) (Oral)   Resp 18   Ht 6\' 4"  (1.93 m)   Wt 230 lb (104.3 kg)   SpO2 98%   BMI 28.00 kg/m   HEMODYNAMICS:   VENTILATOR SETTINGS:   INTAKE / OUTPUT: No intake/output data recorded.  PHYSICAL EXAMINATION: General: sedated, SBP in 60s.  Neuro: arousable, no focal def  HEENT:  Moist mucous membranes pale, no thyromegaly, JVD Cardiovascular:  Regular rate and rhythm, no murmurs rubs gallops Lungs:  Clear no wheezing, crackles, no accessory use  Abdomen:  Distended, soft, positive bowel sounds, no guarding, rigidity Musculoskeletal:  Normal tone and bulk Skin:  Intact, no rash.  LABS:  BMET  Recent Labs Lab 08/22/15 0147 08/22/15 0608 08/22/15 0742  NA 142 143 144  K 4.5 4.6 5.6*  CL 101 102 110  CO2 19* 15* 12*  BUN 15 15 17   CREATININE 1.13 1.03 1.02  GLUCOSE 166* 149* 163*    Electrolytes  Recent Labs Lab 08/22/15 0147 08/22/15 0608 08/22/15 0742   CALCIUM 8.3* 8.1* 7.2*  MG  --   --  1.5*  PHOS  --   --  5.4*    CBC  Recent Labs Lab 08/22/15 0147 08/22/15 0608 08/22/15 0742  WBC 4.3 5.1 4.5  HGB 8.1* 6.3* 5.8*  HCT 25.7* 20.0* 19.3*  PLT 73* 73* 61*    Coag's  Recent Labs Lab 08/22/15 0147 08/22/15 0742  APTT  --  28  INR 1.36 1.80    Sepsis Markers  Recent Labs Lab 08/22/15 0231 08/22/15 0608 08/22/15 0742  LATICACIDVEN 8.00* 8.8* 9.6*  PROCALCITON  --   --  0.18    ABG No results for input(s): PHART, PCO2ART, PO2ART in the last 168 hours.  Liver Enzymes  Recent Labs Lab 08/22/15 0147 08/22/15 0608 08/22/15 0742  AST 240* 304* 763*  ALT 93* 114* 282*  ALKPHOS 107 99 84  BILITOT 2.5* 2.4* 2.6*  ALBUMIN 3.5 3.4* 2.9*    Cardiac Enzymes  Recent Labs Lab 08/22/15 0742  TROPONINI <0.03    Glucose  Recent Labs Lab 08/22/15 0906  GLUCAP 142*    Imaging Dg Chest Port 1 View  Result Date: 08/22/2015 CLINICAL DATA:  Acute respiratory failure. EXAM: PORTABLE CHEST 1 VIEW COMPARISON:  None. FINDINGS: The heart size and mediastinal contours are within normal limits. Both lungs are clear. The visualized skeletal structures are unremarkable. IMPRESSION: No active disease.  Electronically Signed   By: Ted Mcalpine M.D.   On: 08/22/2015 07:38    STUDIES:    CULTURES: Bcx 7/28 > Ucx 7/28> Sputum 7/28>  ANTIBIOTICS: Ceftriaxone 7/28 >  SIGNIFICANT EVENTS:   LINES/TUBES:   DISCUSSION: 60 year old with upper GI bleed, alcohol abuse.Pt has portal hypertension, ascites but no documented varices. H/O gastric bypass surgery. Now admitted w/ working dx of Upper GI bleed. His transfusion was initially held d/t tachycardia felt possibly from transfusion reaction (think more likely this is on-going bleeding). He is on  Protonix & octreotide drips, Empiric coverage of SBP with ceftriaxone. He is now in shock since arrival to the ICU. We will resume transfusion, add levophed for MAP  >65, place large bore CVL and intubate when BP improved to secure airway for EGD. He has declined significantly since admission. Also at risk for significant w/d given his daily ETOH consumption. Some of his tachycardia may also be explained by this.    ASSESSMENT / PLAN:  PULMONARY A: Intubate for airway protection Aspiration risk P:   Full vent support PAD protocol Will assess daily for readiness to wean; however WD may be a factor here   CARDIOVASCULAR A:  Tachycardia> Likely related to ongoing GI bleed Hypovolemic/hemorrhagic shock P:  Transfuse PRBCs Levophed for MAP > 65 Cont crystalloid resuscitation  ICU level monitoring  Central line placement   RENAL A:   Lactic acidosis in setting of GIB and Organ hypoperfusion-->this is NOT sepsis.  Hyperkalemia  Hypomagnesemia  At risk for AKI P:   Monitor urine output and Cr Replace Mg (esp in setting of ETOH as will increase risk of torsades) Repeat Chemistry--->hope hyperkalemia d.t acidosis or hemolysis  GASTROINTESTINAL A:   Upper GI bleed Alcohol abuse H/O gastric bypass Shock liver H/o ascites  ? Cirrhosis given prior paracentesis in hx P:   Keep NPO EGD pending cont protonix and octreotide drips Assess for paracentesis Ceftriaxone for empiric coverage of SBP  HEMATOLOGIC A:   Acute blood loss anemia  Thrombocytopenia Mild coagulopathy  P:  Transfuse 2 units PRBC STAT Vit K X 1 Serial CBCs  INFECTIOUS A:   R/O SBP P:   Ceftriaxone Follow cultures, PCt  ENDOCRINE A:   Diabetes mellitus P:   Hold outpatient insulin while NPO SSI coverge  NEUROLOGIC A:   Heavy h/o ETOH and at high risk for wd P:   Monitor for alcohol withdrawal Thiamine, folic acid IV  FAMILY  - Updates: Patient and significant other updated at bedside - Inter-disciplinary family meet or Palliative Care meeting due by: 8/4  Simonne Martinet ACNP-BC [facility] Pulmonary/Critical Care Pager # [phone] OR # [phone] if  no answer  08/22/2015, 10:02 AM

## 2015-08-22 NOTE — Consult Note (Signed)
[facility] Consultation Note  Referring Provider: Dr. Lorretta Harp Primary Care Physician:  No PCP Per Patient  Reason for Consultation:  GI bleeding  HPI: Bradley Harris is a 60 y.o. male whom I've been asked to see for GI bleeding.  Patient has history of longstanding alcohol abuse.  He is from [place], and here locally at this time visiting his partner.  A couple days ago, patient had multiple episodes of frank hematemesis, followed yesterday by less hematemesis but voluminous melena and maroon stools.  Given escalation of symptoms, patient went to ED.  Found to be anemic, elevated LFTs, thrombocytopenic, hypotensive, tachycardic.  Last alcohol less than 24 hours ago.  No NSAIDs.  No abdominal pain or change in bowel habits.  Had gastric bypass about 5 years ago.  Had similar episode of GI bleeding recently in [place], was hospitalized for several days, amazingly patient tells me that he did not have an endoscopy during that admission.   Past Medical History:  Diagnosis Date  . Alcoholism (HCC)   . Depression   . Diabetes mellitus without complication (HCC)   . Hypercholesterolemia   . Neuropathy [facility])     Past Surgical History:  Procedure Laterality Date  . Gastric bypass surgery    . LUMBAR SPINE SURGERY    . SHOULDER SURGERY      Prior to Admission medications   Medication Sig Start Date End Date Taking? Authorizing Provider  busPIRone (BUSPAR) 5 MG tablet Take 5 mg by mouth 2 (two) times daily.   Yes Historical Provider, MD  citalopram (CELEXA) 20 MG tablet Take 20 mg by mouth daily.   Yes Historical Provider, MD  gabapentin (NEURONTIN) 300 MG capsule Take 300 mg by mouth 3 (three) times daily.   Yes Historical Provider, MD  insulin aspart (NOVOLOG) 100 UNIT/ML injection Inject 1-10 Units into the skin daily as needed for high blood sugar.   Yes Historical Provider, MD  insulin aspart protamine- aspart (NOVOLOG MIX 70/30) (70-30) 100 UNIT/ML injection Inject 10-16  Units into the skin 2 (two) times daily with a meal. Use 16 units every morning and use 10 units every night   Yes Historical Provider, MD  omeprazole (PRILOSEC) 20 MG capsule Take 20 mg by mouth 2 (two) times daily before a meal.   Yes Historical Provider, MD  simvastatin (ZOCOR) 20 MG tablet Take 20 mg by mouth daily.   Yes Historical Provider, MD    Current Facility-Administered Medications  Medication Dose Route Frequency Provider Last Rate Last Dose  . 0.9 %  sodium chloride infusion   Intravenous Once Lorretta Harp, MD      . 0.9 %  sodium chloride infusion  1,000 mL Intravenous Continuous Lorretta Harp, MD   Stopped at 08/22/15 0719  . 0.9 %  sodium chloride infusion  250 mL Intravenous PRN Praveen Mannam, MD      . 0.9 %  sodium chloride infusion   Intravenous Continuous Praveen Mannam, MD      . 0.9 %  sodium chloride infusion   Intravenous Once Praveen Mannam, MD      . 0.9 %  sodium chloride infusion   Intravenous Once Praveen Mannam, MD      . busPIRone (BUSPAR) tablet 5 mg  5 mg Oral BID Lorretta Harp, MD      . cefTRIAXone (ROCEPHIN) 2 g in dextrose 5 % 50 mL IVPB  2 g Intravenous NOW Juliette Mangle, RPH      . [START ON  08/23/2015] cefTRIAXone (ROCEPHIN) 2 g in dextrose 5 % 50 mL IVPB  2 g Intravenous Q24H Juliette Mangle, RPH      . citalopram (CELEXA) tablet 20 mg  20 mg Oral Daily Lorretta Harp, MD      . folic acid injection 1 mg  1 mg Intravenous Daily Praveen Mannam, MD      . gabapentin (NEURONTIN) capsule 300 mg  300 mg Oral TID Lorretta Harp, MD      . insulin aspart (novoLOG) injection 0-15 Units  0-15 Units Subcutaneous Q4H Praveen Mannam, MD      . insulin aspart protamine- aspart (NOVOLOG MIX 70/30) injection 6-10 Units  6-10 Units Subcutaneous BID WC Lorretta Harp, MD      . LORazepam (ATIVAN) injection 0-4 mg  0-4 mg Intravenous Q6H Dione Booze, MD   1 mg at 08/22/15 0606   Followed by  . [START ON 08/24/2015] LORazepam (ATIVAN) injection 0-4 mg  0-4 mg Intravenous Q12H Dione Booze, MD       . octreotide (SANDOSTATIN) 500 mcg in sodium chloride 0.9 % 250 mL (2 mcg/mL) infusion  50 mcg/hr Intravenous Continuous Chilton Greathouse, MD   Stopped at 08/22/15 0739  . ondansetron (ZOFRAN) tablet 4 mg  4 mg Oral Q6H PRN Lorretta Harp, MD       Or  . ondansetron [facility]) injection 4 mg  4 mg Intravenous Q6H PRN Lorretta Harp, MD      . pantoprazole (PROTONIX) 80 mg in sodium chloride 0.9 % 250 mL (0.32 mg/mL) infusion  8 mg/hr Intravenous Continuous Dione Booze, MD 25 mL/hr at 08/22/15 0304 8 mg/hr at 08/22/15 0304  . [START ON 08/25/2015] pantoprazole (PROTONIX) injection 40 mg  40 mg Intravenous Q12H Dione Booze, MD      . simvastatin (ZOCOR) tablet 20 mg  20 mg Oral q1800 Lorretta Harp, MD      . sodium chloride 0.9 % bolus 1,000 mL  1,000 mL Intravenous Once Praveen Mannam, MD 2,000 mL/hr at 08/22/15 0728 1,000 mL at 08/22/15 0728  . sodium chloride flush (NS) 0.9 % injection 3 mL  3 mL Intravenous Q12H Lorretta Harp, MD      . thiamine (VITAMIN B-1) tablet 100 mg  100 mg Oral Daily Dione Booze, MD       Or  . thiamine (B-1) injection 100 mg  100 mg Intravenous Daily Dione Booze, MD       Current Outpatient Prescriptions  Medication Sig Dispense Refill  . busPIRone (BUSPAR) 5 MG tablet Take 5 mg by mouth 2 (two) times daily.    . citalopram (CELEXA) 20 MG tablet Take 20 mg by mouth daily.    Marland Kitchen gabapentin (NEURONTIN) 300 MG capsule Take 300 mg by mouth 3 (three) times daily.    . insulin aspart (NOVOLOG) 100 UNIT/ML injection Inject 1-10 Units into the skin daily as needed for high blood sugar.    . insulin aspart protamine- aspart (NOVOLOG MIX 70/30) (70-30) 100 UNIT/ML injection Inject 10-16 Units into the skin 2 (two) times daily with a meal. Use 16 units every morning and use 10 units every night    . omeprazole (PRILOSEC) 20 MG capsule Take 20 mg by mouth 2 (two) times daily before a meal.    . simvastatin (ZOCOR) 20 MG tablet Take 20 mg by mouth daily.      Allergies as of 08/22/2015  . (No Known  Allergies)    Family History  Problem Relation Age of Onset  .  Diabetes Mellitus I Mother   . Dementia Father     Social History   Social History  . Marital status: Single    Spouse name: N/A  . Number of children: N/A  . Years of education: N/A   Occupational History  . Not on file.   Social History Main Topics  . Smoking status: Never Smoker  . Smokeless tobacco: Not on file  . Alcohol use Yes  . Drug use: No  . Sexual activity: Not on file   Other Topics Concern  . Not on file   Social History Narrative  . No narrative on file    Review of Systems: Positive = bold Gen: Denies any fever, chills, rigors, night sweats, anorexia, fatigue, weakness, malaise, involuntary weight loss, and sleep disorder CV: Denies chest pain, angina, palpitations, syncope, orthopnea, PND, peripheral edema, and claudication. Resp: Denies dyspnea, cough, sputum, wheezing, coughing up blood. GI: Described in detail in HPI.    GU : Denies urinary burning, blood in urine, urinary frequency, urinary hesitancy, nocturnal urination, and urinary incontinence. MS: Denies joint pain or swelling.  Denies muscle weakness, cramps, atrophy.  Derm: Denies rash, itching, oral ulcerations, hives, unhealing ulcers.  Psych: Denies depression, anxiety, memory loss, suicidal ideation, hallucinations,  and confusion. Heme: Denies bruising, bleeding, and enlarged lymph nodes. Neuro:  Denies any headaches, dizziness, paresthesias. Endo:  Denies any problems with DM, thyroid, adrenal function.  Physical Exam: Vital signs in last 24 hours: Temp:  [97.8 F (36.6 C)-98.1 F (36.7 C)] 98 F (36.7 C) (07/28 0545) Pulse Rate:  [106-163] 153 (07/28 0719) Resp:  [10-35] 19 (07/28 0719) BP: (83-111)/(44-71) 88/60 (07/28 0719) SpO2:  [95 %-100 %] 99 % (07/28 0719) Weight:  [104.3 kg (230 lb)] 104.3 kg (230 lb) (07/28 0138)   General:   Alert, obese, older-appearing than stated age,pleasant and cooperative in  NAD Head:  Normocephalic and atraumatic. Eyes:  Scleral icterus bilaterally;  Conjunctiva pale Ears:  Normal auditory acuity. Nose:  No deformity, discharge,  or lesions. Mouth:  No deformity or lesions.  Oropharynx pale and dry Neck:  Supple; no masses or thyromegaly. Lungs:  Clear throughout to auscultation.   No wheezes, crackles, or rhonchi. No acute distress. Heart:  Tachcardc, regular rhythm; no murmurs, clicks, rubs,  or gallops. Abdomen:  Soft, protuberant, mild distended (I suspect from abdominal wall fat; ascites by my exam is not favored); nontender. No masses, firm liver edge palpable about 3cm below right costal margin along mid-clavicular line. Normal bowel sounds, without guarding, and without rebound.     Msk:  Symmetrical without gross deformities. Normal posture. Pulses:  Normal pulses noted. Extremities:  Without clubbing; trace lower extremity edema Neurologic:  Alert and  oriented x4; diffusely weak, otherwise grossly normal neurologically.  No encephalopathy Skin:  Large bruise post-fall on right flank; scattered ecchymoses; pale-appearing throughout, otherwise intact without significant lesions or rashes. Psych:  Alert and cooperative. Normal mood and affect.   Lab Results:  Recent Labs  08/22/15 0147 08/22/15 0608  WBC 4.3 5.1  HGB 8.1* 6.3*  HCT 25.7* 20.0*  PLT 73* 73*   BMET  Recent Labs  08/22/15 0147 08/22/15 0608  NA 142 143  K 4.5 4.6  CL 101 102  CO2 19* 15*  GLUCOSE 166* 149*  BUN 15 15  CREATININE 1.13 1.03  CALCIUM 8.3* 8.1*   LFT  Recent Labs  08/22/15 0608  PROT 6.3*  ALBUMIN 3.4*  AST 304*  ALT 114*  ALKPHOS  99  BILITOT 2.4*   PT/INR  Recent Labs  08/22/15 0147  LABPROT 16.9*  INR 1.36    Studies/Results: Dg Chest Port 1 View  Result Date: 08/22/2015 CLINICAL DATA:  Acute respiratory failure. EXAM: PORTABLE CHEST 1 VIEW COMPARISON:  None. FINDINGS: The heart size and mediastinal contours are within normal  limits. Both lungs are clear. The visualized skeletal structures are unremarkable. IMPRESSION: No active disease. Electronically Signed   By: Ted Mcalpine M.D.   On: 08/22/2015 07:38  Impression:  1.  GI bleeding, manifested by (a) maroon stools, (b) melenic stools, and (c) hematemesis.  Suspect esophageal or gastric variceal bleeding.  Patient is alert but moderately tachycardic and modestly hypotensive.   2.  Acute blood loss anemia. 3.  Alcohol abuse. 4.  Elevated LFTs, AST:ALT > 2:1.  LFTs, in conjunction with thrombocytopenia, strongly raises possibility of cirrhosis. 5.  Obesity, remote gastric bypass. 6.  Abdominal distention versus abdominal wall fat versus both. 7.  Recent similar prior GI bleeding in [place], one week hospitalization, patient reports not having endoscopy at that time (which is mind-boggling).  Plan:  1.  Transfer patient to ICU under PCCM care. 2.  Volume repletion (IVF and PRBCs).  Will need another 2 units of PRBCs prior to endoscopy, unless he is acutely crashing. 3.  Octreotide and pantoprazole drips. 4.  Antibiotics for SBP prophylaxis unless/until ascites has been excluded as cause of his abdominal distention. 5.  Expedited endoscopy in ICU once he's been stabilized.  He will need to be intubated in all likelihood pre-procedure. 6.  I had very frank discussion with patient that alcohol is likely root cause of many of his current problems, and that continued alcohol use is highly likely to significantly shorten his lifespan and adversely affect his remaining quality-of-life. 7.  Risks (bleeding, infection, bowel perforation that could require surgery, sedation-related changes in cardiopulmonary systems), benefits (identification and possible treatment of source of symptoms, exclusion of certain causes of symptoms), and alternatives (watchful waiting, radiographic imaging studies, empiric medical treatment) of upper endoscopy (EGD) were explained to  patient/family in detail and patient wishes to proceed.   LOS: 0 days   Deziah Renwick M  08/22/2015, 7:48 AM  Pager [phone] If no answer or after 5 PM call [phone]

## 2015-08-22 NOTE — Procedures (Signed)
Central Venous Catheter Insertion Procedure Note Bradley Harris [id number] [birth year]  Procedure: Insertion of Central Venous Catheter Indications: Assessment of intravascular volume, Drug and/or fluid administration and Frequent blood sampling  Procedure Details Consent: Risks of procedure as well as the alternatives and risks of each were explained to the (patient/caregiver).  Consent for procedure obtained. Time Out: Verified patient identification, verified procedure, site/side was marked, verified correct patient position, special equipment/implants available, medications/allergies/relevent history reviewed, required imaging and test results available.  Performed Real time [place] was used to ID and cannulate the Right IJ  Maximum sterile technique was used including antiseptics, cap, gloves, gown, hand hygiene, mask and sheet. Skin prep: Chlorhexidine; local anesthetic administered A antimicrobial bonded/coated triple lumen catheter was placed in the right internal jugular vein using the Seldinger technique.  Evaluation Blood flow good Complications: No apparent complications Patient did tolerate procedure well. Chest X-ray ordered to verify placement.  CXR: pending.  Bradley Harris 08/22/2015, 10:35 AM

## 2015-08-22 NOTE — ED Notes (Signed)
Spoke to blood bank who reports that blood to transfuse will not be ready until complete evaluation of previous possible reaction is complete.

## 2015-08-22 NOTE — ED Triage Notes (Signed)
Patient reports bloody emesis with dark stools onset this week , lightheaded and  generalized weakness onset this week , hypotensive at arrival , pt. stated he drinks ETOH everyday .

## 2015-08-22 NOTE — Procedures (Signed)
Intubation Procedure Note Bradley Harris [id number] 03-03-1955  Procedure: Intubation Indications: Airway protection and maintenance  Procedure Details Consent: Risks of procedure as well as the alternatives and risks of each were explained to the (patient/caregiver).  Consent for procedure obtained. Time Out: Verified patient identification, verified procedure, site/side was marked, verified correct patient position, special equipment/implants available, medications/allergies/relevent history reviewed, required imaging and test results available.  Performed  Maximum sterile technique was used including cap, gloves, gown, hand hygiene and mask.  MAC and 4  Hemodynamically stabilised with levophed & PRBC prior to procedure 2mg  versed fent 100 mcg Etomidate 20 Rocuronium 50  -in divided doses  Evaluation Hemodynamic Status: Persistent hypotension treated with pressors; O2 sats: stable throughout Patient's Current Condition: stable Complications: No apparent complications Patient did tolerate procedure well. Chest X-ray ordered to verify placement.  CXR: pending.   ALVA,RAKESH V. 08/22/2015

## 2015-08-22 NOTE — Progress Notes (Signed)
PCCM Rounding Note:  Patient with profound metabolic/lactic acidosis. PCO2 on ABG optimized. Trending lactic acid. Starting Bicarb gtt at 150cc/hr IV & giving 1amp Bicarb bolus. Repeat ABG at 1900 tonight.  Donna Christen Jamison Neighbor, M.D. [facility] Pager:  [phone] After 3pm or if no response, call [phone] 2:23 PM 08/22/15

## 2015-08-22 NOTE — Consult Note (Signed)
Reason for Consult:  GI bleeding Referring Physician: Dr. Wilford Corner  Bradley Harris is an 60 y.o. male.  HPI: Pt presents to the ED on 08/22/15 with bloody emesis x 12 with some bright red blood, dark stool with clots, light headed and generalized weakness.  Work up in the ED shows hypotension, tachycardia, H/H 6.3/20.  Hyperkalemia, D Dimer 3.29, INR 1.36 with repeat of 1.8.  ETOH 316. CXR OK, but intubated by 1131 hours.  He was admitted with GI bleed and ETOH abuse.  He has been transfused. He was seen by GI and taken to the Endo suite for EGD.  Dr. Michail Sermon reports: Actively bleeding Mallory-Weiss tear with large amount of blood in gastric pouch. Large esophageal varices seen without bleeding stigmata. Hemostasis achieved with bicap gold probe cautery. If rebleeding occurs, then needs IR for embolization or surgery. He is continuing on  PPI  And Octreotide drip.   Repeat labs are pending after being transfused 4 units of PRBC. PMH:  is significant for alcohol abuse, diabetes and depression. He was hospitalized at home in [place] for several days for the same issue earlier this year.   He has a hx of gastric bypass. History is from the chart, pt unable to answer questions on Vent.  Past Medical History:  Diagnosis Date  . Alcoholism ([place])   . Depression   . Diabetes mellitus without complication ([place])   . Hypercholesterolemia   . Neuropathy [facility])     Past Surgical History:  Procedure Laterality Date  . Gastric bypass surgery    . LUMBAR SPINE SURGERY    . SHOULDER SURGERY      Family History  Problem Relation Age of Onset  . Diabetes Mellitus I Mother   . Dementia Father     Social History:  reports that he has never smoked. He has never used smokeless tobacco. He reports that he drinks alcohol. He reports that he does not use drugs.  Allergies: No Known Allergies  Medications:  Prior to Admission:  Prescriptions Prior to Admission  Medication Sig Dispense Refill Last  Dose  . busPIRone (BUSPAR) 5 MG tablet Take 5 mg by mouth 2 (two) times daily.   08/21/2015 at Unknown time  . citalopram (CELEXA) 20 MG tablet Take 20 mg by mouth daily.   08/21/2015 at Unknown time  . gabapentin (NEURONTIN) 300 MG capsule Take 300 mg by mouth 3 (three) times daily.   08/21/2015 at Unknown time  . insulin aspart (NOVOLOG) 100 UNIT/ML injection Inject 1-10 Units into the skin daily as needed for high blood sugar.   Past Month at Unknown time  . insulin aspart protamine- aspart (NOVOLOG MIX 70/30) (70-30) 100 UNIT/ML injection Inject 10-16 Units into the skin 2 (two) times daily with a meal. Use 16 units every morning and use 10 units every night   08/21/2015 at Unknown time  . omeprazole (PRILOSEC) 20 MG capsule Take 20 mg by mouth 2 (two) times daily before a meal.   08/21/2015 at Unknown time  . simvastatin (ZOCOR) 20 MG tablet Take 20 mg by mouth daily.   08/21/2015 at Unknown time   Scheduled: . sodium chloride   Intravenous Once  . sodium chloride   Intravenous Once  . sodium chloride   Intravenous Once  . sodium chloride   Intravenous Once  . sodium chloride   Intravenous Once  . antiseptic oral rinse  7 mL Mouth Rinse QID  . cefTRIAXone (ROCEPHIN)  IV  2 g  Intravenous NOW  . [START ON 08/23/2015] cefTRIAXone (ROCEPHIN)  IV  2 g Intravenous Q24H  . chlorhexidine gluconate (SAGE KIT)  15 mL Mouth Rinse BID  . citalopram  20 mg Oral Daily  . fentaNYL (SUBLIMAZE) injection  50 mcg Intravenous Once  . folic acid  1 mg Intravenous Daily  . insulin aspart  0-15 Units Subcutaneous Q4H  . [START ON 08/25/2015] pantoprazole  40 mg Intravenous Q12H  . sodium chloride flush  3 mL Intravenous Q12H  . thiamine  100 mg Oral Daily   Or  . thiamine  100 mg Intravenous Daily   Continuous: . sodium chloride Stopped (08/22/15 0719)  . sodium chloride 125 mL/hr at 08/22/15 1114  . fentaNYL infusion INTRAVENOUS 350 mcg/hr (08/22/15 1324)  . norepinephrine (LEVOPHED) Adult infusion 45  mcg/min (08/22/15 1325)  . octreotide  (SANDOSTATIN)    IV infusion 50 mcg/hr (08/22/15 0748)  . pantoprozole (PROTONIX) infusion 8 mg/hr (08/22/15 0304)  . vasopressin (PITRESSIN) infusion - *FOR SHOCK*     PJA:SNKNLZ chloride, fentaNYL, midazolam, midazolam, [DISCONTINUED] ondansetron **OR** ondansetron (ZOFRAN) IV Anti-infectives    Start     Dose/Rate Route Frequency Ordered Stop   08/23/15 0600  cefTRIAXone (ROCEPHIN) 2 g in dextrose 5 % 50 mL IVPB     2 g 100 mL/hr over 30 Minutes Intravenous Every 24 hours 08/22/15 0658     08/22/15 0700  cefTRIAXone (ROCEPHIN) 2 g in dextrose 5 % 50 mL IVPB     2 g 100 mL/hr over 30 Minutes Intravenous NOW 08/22/15 0658 08/23/15 0700      Results for orders placed or performed during the hospital encounter of 08/22/15 (from the past 48 hour(s))  Sample to Blood Bank     Status: None   Collection Time: 08/22/15  1:40 AM  Result Value Ref Range   Blood Bank Specimen SAMPLE AVAILABLE FOR TESTING    Sample Expiration 08/23/2015   Type and screen [place]     Status: None (Preliminary result)   Collection Time: 08/22/15  1:40 AM  Result Value Ref Range   ABO/RH(D) O POS    Antibody Screen NEG    Sample Expiration 08/25/2015    Unit Number [id number]    Blood Component Type RBC LR PHER1    Unit division 00    Status of Unit REL FROM [facility]    Transfusion Status OK TO TRANSFUSE    Crossmatch Result Compatible    Unit Number [id number]    Blood Component Type RED CELLS,LR    Unit division 00    Status of Unit ISSUED    Transfusion Status OK TO TRANSFUSE    Crossmatch Result Compatible    Unit Number [id number]    Blood Component Type RED CELLS,LR    Unit division 00    Status of Unit REL FROM [facility]    Transfusion Status OK TO TRANSFUSE    Crossmatch Result Compatible   ABO/Rh     Status: None   Collection Time: 08/22/15  1:40 AM  Result Value Ref Range   ABO/RH(D) O POS   CBC with Differential     Status:  Abnormal   Collection Time: 08/22/15  1:47 AM  Result Value Ref Range   WBC 4.3 4.0 - 10.5 K/uL   RBC 2.97 (L) 4.22 - 5.81 MIL/uL   Hemoglobin 8.1 (L) 13.0 - 17.0 g/dL   HCT 25.7 (L) 39.0 - 52.0 %   MCV 86.5 78.0 - 100.0  fL   MCH 27.3 26.0 - 34.0 pg   MCHC 31.5 30.0 - 36.0 g/dL   RDW 17.5 (H) 11.5 - 15.5 %   Platelets 73 (L) 150 - 400 K/uL    Comment: PLATELET COUNT CONFIRMED BY SMEAR   Neutrophils Relative % 64 %   Lymphocytes Relative 28 %   Monocytes Relative 7 %   Eosinophils Relative 0 %   Basophils Relative 1 %   Neutro Abs 2.8 1.7 - 7.7 K/uL   Lymphs Abs 1.2 0.7 - 4.0 K/uL   Monocytes Absolute 0.3 0.1 - 1.0 K/uL   Eosinophils Absolute 0.0 0.0 - 0.7 K/uL   Basophils Absolute 0.0 0.0 - 0.1 K/uL   RBC Morphology ELLIPTOCYTES    WBC Morphology MILD LEFT SHIFT (1-5% METAS, OCC MYELO, OCC BANDS)   Comprehensive metabolic panel     Status: Abnormal   Collection Time: 08/22/15  1:47 AM  Result Value Ref Range   Sodium 142 135 - 145 mmol/L   Potassium 4.5 3.5 - 5.1 mmol/L   Chloride 101 101 - 111 mmol/L   CO2 19 (L) 22 - 32 mmol/L   Glucose, Bld 166 (H) 65 - 99 mg/dL   BUN 15 6 - 20 mg/dL   Creatinine, Ser 1.13 0.61 - 1.24 mg/dL   Calcium 8.3 (L) 8.9 - 10.3 mg/dL   Total Protein 6.5 6.5 - 8.1 g/dL   Albumin 3.5 3.5 - 5.0 g/dL   AST 240 (H) 15 - 41 U/L   ALT 93 (H) 17 - 63 U/L   Alkaline Phosphatase 107 38 - 126 U/L   Total Bilirubin 2.5 (H) 0.3 - 1.2 mg/dL   GFR calc non Af Amer >60 >60 mL/min   GFR calc Af Amer >60 >60 mL/min    Comment: (NOTE) The eGFR has been calculated using the CKD EPI equation. This calculation has not been validated in all clinical situations. eGFR's persistently <60 mL/min signify possible Chronic Kidney Disease.    Anion gap 22 (H) 5 - 15    Comment: RESULT CHECKED  Ethanol     Status: Abnormal   Collection Time: 08/22/15  1:47 AM  Result Value Ref Range   Alcohol, Ethyl (B) 316 (HH) <5 mg/dL    Comment:        LOWEST DETECTABLE LIMIT  FOR SERUM ALCOHOL IS 5 mg/dL FOR MEDICAL PURPOSES ONLY CRITICAL RESULT CALLED TO, READ BACK BY AND VERIFIED WITH: STRAUGHN,K RN 08/22/2015 0303 JORDANS   Protime-INR     Status: Abnormal   Collection Time: 08/22/15  1:47 AM  Result Value Ref Range   Prothrombin Time 16.9 (H) 11.4 - 15.2 seconds   INR 1.36   Pathologist smear review     Status: None   Collection Time: 08/22/15  1:47 AM  Result Value Ref Range   Path Review Normocytic anemia.Thrombocytopenia     Comment: Reviewed by Kalman Drape, M.D. [phone]   I-Stat CG4 Lactic Acid, ED     Status: Abnormal   Collection Time: 08/22/15  2:31 AM  Result Value Ref Range   Lactic Acid, Venous 8.00 (HH) 0.5 - 1.9 mmol/L   Comment NOTIFIED PHYSICIAN   Prepare RBC     Status: None   Collection Time: 08/22/15  4:05 AM  Result Value Ref Range   Order Confirmation ORDER PROCESSED BY BLOOD BANK   CBC     Status: Abnormal   Collection Time: 08/22/15  6:08 AM  Result Value Ref Range  WBC 5.1 4.0 - 10.5 K/uL   RBC 2.29 (L) 4.22 - 5.81 MIL/uL   Hemoglobin 6.3 (LL) 13.0 - 17.0 g/dL    Comment: REPEATED TO VERIFY CRITICAL RESULT CALLED TO, READ BACK BY AND VERIFIED WITH: HILL,J RN @ 6295 08/22/15 LEONARD,A    HCT 20.0 (L) 39.0 - 52.0 %   MCV 87.3 78.0 - 100.0 fL   MCH 27.5 26.0 - 34.0 pg   MCHC 31.5 30.0 - 36.0 g/dL   RDW 17.7 (H) 11.5 - 15.5 %   Platelets 73 (L) 150 - 400 K/uL    Comment: CONSISTENT WITH PREVIOUS RESULT  Comprehensive metabolic panel     Status: Abnormal   Collection Time: 08/22/15  6:08 AM  Result Value Ref Range   Sodium 143 135 - 145 mmol/L   Potassium 4.6 3.5 - 5.1 mmol/L   Chloride 102 101 - 111 mmol/L   CO2 15 (L) 22 - 32 mmol/L   Glucose, Bld 149 (H) 65 - 99 mg/dL   BUN 15 6 - 20 mg/dL   Creatinine, Ser 1.03 0.61 - 1.24 mg/dL   Calcium 8.1 (L) 8.9 - 10.3 mg/dL   Total Protein 6.3 (L) 6.5 - 8.1 g/dL   Albumin 3.4 (L) 3.5 - 5.0 g/dL   AST 304 (H) 15 - 41 U/L   ALT 114 (H) 17 - 63 U/L   Alkaline  Phosphatase 99 38 - 126 U/L   Total Bilirubin 2.4 (H) 0.3 - 1.2 mg/dL   GFR calc non Af Amer >60 >60 mL/min   GFR calc Af Amer >60 >60 mL/min    Comment: (NOTE) The eGFR has been calculated using the CKD EPI equation. This calculation has not been validated in all clinical situations. eGFR's persistently <60 mL/min signify possible Chronic Kidney Disease.    Anion gap 26 (H) 5 - 15  Lactic acid, plasma     Status: Abnormal   Collection Time: 08/22/15  6:08 AM  Result Value Ref Range   Lactic Acid, Venous 8.8 (HH) 0.5 - 1.9 mmol/L    Comment: CRITICAL RESULT CALLED TO, READ BACK BY AND VERIFIED WITH: JASMINE HILL,RN AT 0711 08/22/15 BY ZBEECH.   Transfusion reaction     Status: None (Preliminary result)   Collection Time: 08/22/15  6:19 AM  Result Value Ref Range   Post RXN DAT IgG NEG    DAT C3 NEG    Path interp tx rxn      No laboratory evidence of hemolytic transfusion reaction. Reviewed by Dr. Claudette Laws 08/22/15.  Type and screen [place]     Status: None (Preliminary result)   Collection Time: 08/22/15  6:43 AM  Result Value Ref Range   ABO/RH(D) O POS    Antibody Screen NEG    Sample Expiration 08/25/2015    Unit Number [id number]    Blood Component Type RBC LR PHER1    Unit division 00    Status of Unit ISSUED    Transfusion Status OK TO TRANSFUSE    Crossmatch Result Compatible    Unit Number [id number]    Blood Component Type RED CELLS,LR    Unit division 00    Status of Unit ISSUED    Transfusion Status OK TO TRANSFUSE    Crossmatch Result Compatible    Unit Number [id number]    Blood Component Type RED CELLS,LR    Unit division 00    Status of Unit ISSUED    Transfusion Status OK TO  TRANSFUSE    Crossmatch Result Compatible    Unit Number [id number]    Blood Component Type RED CELLS,LR    Unit division 00    Status of Unit ISSUED    Transfusion Status OK TO TRANSFUSE    Crossmatch Result Compatible    Unit Number  [id number]    Blood Component Type RED CELLS,LR    Unit division 00    Status of Unit ALLOCATED    Transfusion Status OK TO TRANSFUSE    Crossmatch Result Compatible    Unit Number [id number]    Blood Component Type RED CELLS,LR    Unit division 00    Status of Unit ALLOCATED    Transfusion Status OK TO TRANSFUSE    Crossmatch Result Compatible   Prepare RBC     Status: None   Collection Time: 08/22/15  6:43 AM  Result Value Ref Range   Order Confirmation ORDER PROCESSED BY BLOOD BANK   Comprehensive metabolic panel     Status: Abnormal   Collection Time: 08/22/15  7:42 AM  Result Value Ref Range   Sodium 144 135 - 145 mmol/L   Potassium 5.6 (H) 3.5 - 5.1 mmol/L   Chloride 110 101 - 111 mmol/L   CO2 12 (L) 22 - 32 mmol/L   Glucose, Bld 163 (H) 65 - 99 mg/dL   BUN 17 6 - 20 mg/dL   Creatinine, Ser 1.02 0.61 - 1.24 mg/dL   Calcium 7.2 (L) 8.9 - 10.3 mg/dL   Total Protein 5.6 (L) 6.5 - 8.1 g/dL   Albumin 2.9 (L) 3.5 - 5.0 g/dL   AST 763 (H) 15 - 41 U/L   ALT 282 (H) 17 - 63 U/L   Alkaline Phosphatase 84 38 - 126 U/L   Total Bilirubin 2.6 (H) 0.3 - 1.2 mg/dL   GFR calc non Af Amer >60 >60 mL/min   GFR calc Af Amer >60 >60 mL/min    Comment: (NOTE) The eGFR has been calculated using the CKD EPI equation. This calculation has not been validated in all clinical situations. eGFR's persistently <60 mL/min signify possible Chronic Kidney Disease.    Anion gap 22 (H) 5 - 15  Magnesium     Status: Abnormal   Collection Time: 08/22/15  7:42 AM  Result Value Ref Range   Magnesium 1.5 (L) 1.7 - 2.4 mg/dL  Phosphorus     Status: Abnormal   Collection Time: 08/22/15  7:42 AM  Result Value Ref Range   Phosphorus 5.4 (H) 2.5 - 4.6 mg/dL  Amylase     Status: None   Collection Time: 08/22/15  7:42 AM  Result Value Ref Range   Amylase 38 28 - 100 U/L  Lipase, blood     Status: None   Collection Time: 08/22/15  7:42 AM  Result Value Ref Range   Lipase 42 11 - 51 U/L  Troponin  I     Status: None   Collection Time: 08/22/15  7:42 AM  Result Value Ref Range   Troponin I <0.03 <0.03 ng/mL  Procalcitonin     Status: None   Collection Time: 08/22/15  7:42 AM  Result Value Ref Range   Procalcitonin 0.18 ng/mL    Comment:        Interpretation: PCT (Procalcitonin) <= 0.5 ng/mL: Systemic infection (sepsis) is not likely. Local bacterial infection is possible. (NOTE)         ICU PCT Algorithm  Non ICU PCT Algorithm    ----------------------------     ------------------------------         PCT < 0.25 ng/mL                 PCT < 0.1 ng/mL     Stopping of antibiotics            Stopping of antibiotics       strongly encouraged.               strongly encouraged.    ----------------------------     ------------------------------       PCT level decrease by               PCT < 0.25 ng/mL       >= 80% from peak PCT       OR PCT 0.25 - 0.5 ng/mL          Stopping of antibiotics                                             encouraged.     Stopping of antibiotics           encouraged.    ----------------------------     ------------------------------       PCT level decrease by              PCT >= 0.25 ng/mL       < 80% from peak PCT        AND PCT >= 0.5 ng/mL            Continuin g antibiotics                                              encouraged.       Continuing antibiotics            encouraged.    ----------------------------     ------------------------------     PCT level increase compared          PCT > 0.5 ng/mL         with peak PCT AND          PCT >= 0.5 ng/mL             Escalation of antibiotics                                          strongly encouraged.      Escalation of antibiotics        strongly encouraged.   Brain natriuretic peptide     Status: None   Collection Time: 08/22/15  7:42 AM  Result Value Ref Range   B Natriuretic Peptide 14.7 0.0 - 100.0 pg/mL  Cortisol     Status: None   Collection Time: 08/22/15  7:42 AM   Result Value Ref Range   Cortisol, Plasma 37.2 ug/dL    Comment: (NOTE) AM    6.7 - 22.6 ug/dL PM   <10.0       ug/dL   CBC WITH DIFFERENTIAL     Status: Abnormal   Collection Time: 08/22/15  7:42 AM  Result Value Ref Range   WBC 4.5 4.0 - 10.5 K/uL   RBC 2.17 (L) 4.22 - 5.81 MIL/uL   Hemoglobin 5.8 (LL) 13.0 - 17.0 g/dL    Comment: REPEATED TO VERIFY CRITICAL VALUE NOTED.  VALUE IS CONSISTENT WITH PREVIOUSLY REPORTED AND CALLED VALUE.    HCT 19.3 (L) 39.0 - 52.0 %   MCV 88.9 78.0 - 100.0 fL   MCH 26.7 26.0 - 34.0 pg   MCHC 30.1 30.0 - 36.0 g/dL   RDW 17.7 (H) 11.5 - 15.5 %   Platelets 61 (L) 150 - 400 K/uL    Comment: CONSISTENT WITH PREVIOUS RESULT REPEATED TO VERIFY    Neutrophils Relative % 79 %   Lymphocytes Relative 11 %   Monocytes Relative 10 %   Eosinophils Relative 0 %   Basophils Relative 0 %   Neutro Abs 3.5 1.7 - 7.7 K/uL   Lymphs Abs 0.5 (L) 0.7 - 4.0 K/uL   Monocytes Absolute 0.5 0.1 - 1.0 K/uL   Eosinophils Absolute 0.0 0.0 - 0.7 K/uL   Basophils Absolute 0.0 0.0 - 0.1 K/uL   RBC Morphology TEARDROP CELLS     Comment: ELLIPTOCYTES  Protime-INR     Status: Abnormal   Collection Time: 08/22/15  7:42 AM  Result Value Ref Range   Prothrombin Time 21.1 (H) 11.4 - 15.2 seconds   INR 1.80   APTT     Status: None   Collection Time: 08/22/15  7:42 AM  Result Value Ref Range   aPTT 28 24 - 36 seconds  D-dimer, quantitative (not at [facility])     Status: Abnormal   Collection Time: 08/22/15  7:42 AM  Result Value Ref Range   D-Dimer, Quant 3.29 (H) 0.00 - 0.50 ug/mL-FEU    Comment: (NOTE) At the manufacturer cut-off of 0.50 ug/mL FEU, this assay has been documented to exclude PE with a sensitivity and negative predictive value of 97 to 99%.  At this time, this assay has not been approved by the FDA to exclude DVT/VTE. Results should be correlated with clinical presentation.   Lactic acid, plasma     Status: Abnormal   Collection Time: 08/22/15  7:42 AM   Result Value Ref Range   Lactic Acid, Venous 9.6 (HH) 0.5 - 1.9 mmol/L    Comment: CRITICAL RESULT CALLED TO, READ BACK BY AND VERIFIED WITH: L.FLOOD,RN 0946 08/22/15 CLARK,S   Prepare RBC     Status: None   Collection Time: 08/22/15  7:46 AM  Result Value Ref Range   Order Confirmation ORDER PROCESSED BY BLOOD BANK   Glucose, capillary     Status: Abnormal   Collection Time: 08/22/15  9:06 AM  Result Value Ref Range   Glucose-Capillary 142 (H) 65 - 99 mg/dL   Comment 1 Notify RN    Comment 2 Document in Chart   Glucose, capillary     Status: Abnormal   Collection Time: 08/22/15 11:11 AM  Result Value Ref Range   Glucose-Capillary 151 (H) 65 - 99 mg/dL   Comment 1 Notify RN    Comment 2 Document in Chart   CBC     Status: Abnormal   Collection Time: 08/22/15 11:55 AM  Result Value Ref Range   WBC 7.3 4.0 - 10.5 K/uL   RBC 2.60 (L) 4.22 - 5.81 MIL/uL   Hemoglobin 7.1 (L) 13.0 - 17.0 g/dL   HCT 23.3 (L) 39.0 - 52.0 %   MCV 89.6 78.0 - 100.0 fL  MCH 27.3 26.0 - 34.0 pg   MCHC 30.5 30.0 - 36.0 g/dL   RDW 16.8 (H) 11.5 - 15.5 %   Platelets 69 (L) 150 - 400 K/uL    Comment: CONSISTENT WITH PREVIOUS RESULT  Basic metabolic panel     Status: Abnormal   Collection Time: 08/22/15 12:00 PM  Result Value Ref Range   Sodium 145 135 - 145 mmol/L   Potassium 5.7 (H) 3.5 - 5.1 mmol/L   Chloride 111 101 - 111 mmol/L   CO2 14 (L) 22 - 32 mmol/L   Glucose, Bld 161 (H) 65 - 99 mg/dL   BUN 17 6 - 20 mg/dL   Creatinine, Ser 0.99 0.61 - 1.24 mg/dL   Calcium 7.2 (L) 8.9 - 10.3 mg/dL   GFR calc non Af Amer >60 >60 mL/min   GFR calc Af Amer >60 >60 mL/min    Comment: (NOTE) The eGFR has been calculated using the CKD EPI equation. This calculation has not been validated in all clinical situations. eGFR's persistently <60 mL/min signify possible Chronic Kidney Disease.    Anion gap 20 (H) 5 - 15  Prepare RBC     Status: None   Collection Time: 08/22/15 12:19 PM  Result Value Ref Range    Order Confirmation ORDER PROCESSED BY BLOOD BANK   Prepare RBC     Status: None   Collection Time: 08/22/15 12:59 PM  Result Value Ref Range   Order Confirmation ORDER PROCESSED BY BLOOD BANK     Dg Chest Port 1 View  Result Date: 08/22/2015 CLINICAL DATA:  Status post intubation and central line placement today. EXAM: PORTABLE CHEST 1 VIEW COMPARISON:  Single-view of the chest earlier today. FINDINGS: Endotracheal tube is in place with the tip in good position 4 cm above the carina. Right IJ catheter tip projects in the lower superior vena cava. Negative for pneumothorax. Lungs are clear. Heart size is normal. No pleural effusion. IMPRESSION: ETT and right IJ catheter projecting good position. Negative for pneumothorax or acute disease. Electronically Signed   By: Inge Rise M.D.   On: 08/22/2015 11:31  Dg Chest Port 1 View  Result Date: 08/22/2015 CLINICAL DATA:  Acute respiratory failure. EXAM: PORTABLE CHEST 1 VIEW COMPARISON:  None. FINDINGS: The heart size and mediastinal contours are within normal limits. Both lungs are clear. The visualized skeletal structures are unremarkable. IMPRESSION: No active disease. Electronically Signed   By: Fidela Salisbury M.D.   On: 08/22/2015 07:38   Review of Systems  Unable to perform ROS: Intubated   Blood pressure 94/65, pulse (!) 125, temperature 98.7 F (37.1 C), temperature source Oral, resp. rate (!) 21, height 6' 4"  (1.93 m), weight 104.3 kg (230 lb), SpO2 99 %. Physical Exam  Constitutional: He appears well-developed and well-nourished. No distress.  Still tachycardic, SBP in the 90's.  On Vent.  He wakes up and tries to talk then falls asleep.  HENT:  Head: Normocephalic and atraumatic.  No NG in place.  Intubated.    Eyes: Right eye exhibits no discharge. Left eye exhibits no discharge. No scleral icterus.  Neck: No JVD present. No tracheal deviation present. No thyromegaly present.  Cardiovascular: Regular rhythm and normal  heart sounds.   No murmur heard. tachycardic  Respiratory: Breath sounds normal. No respiratory distress. He has no wheezes. He has no rales. He exhibits no tenderness.  On vent and appears comfortable.  GI: Soft. He exhibits distension (minimal ). He exhibits no mass. There  is no tenderness. There is no rebound and no guarding.  Musculoskeletal: He exhibits no edema or tenderness.  Lymphadenopathy:    He has no cervical adenopathy.  Neurological:  Sedated on the Vent , tried to talk some, but went back to sleep on Vent.  Skin: Skin is warm and dry. No rash noted. He is not diaphoretic. No erythema. No pallor.  Psychiatric:  Seem calm on Vent, no visible distress.    Assessment/Plan: Mack Guise tear with bleeding Recurrent GI bleed Hx of ETOH use AODM Insulin dependant Neuropathy Depression Dyslipidemia    Plan:  He is stable right now.  He is still intubated and they plan to watch him like this and monitor bleeding.  Agree with Dr. Michail Sermon.  If he rebleeds, continue supportive care,  IR evaluation for embolization. Repeat H/H is drawn and pending. We will be available and follow with you.     Bradley Harris 08/22/2015, 1:43 PM

## 2015-08-22 NOTE — Progress Notes (Signed)
Initial Nutrition Assessment  DOCUMENTATION CODES:   Not applicable  INTERVENTION:    When able to begin enteral nutrition, recommend Vital AF 1.2 at 80 ml/h to provide 1920 ml, 2304 kcal, 144 gm protein, 1557 ml free water daily.  NUTRITION DIAGNOSIS:   Inadequate oral intake related to inability to eat as evidenced by NPO status.  GOAL:   Patient will meet greater than or equal to 90% of their needs  MONITOR:   Vent status, Weight trends, Labs, I & O's  REASON FOR ASSESSMENT:   Ventilator    ASSESSMENT:   60 year old male, who presented to the ED on 08/22/15 with bloody emesis x 12 with some bright red blood, dark stool with clots, light headed and generalized weakness. Hx of gastric bypass surgery.  Labs reviewed: potassium elevated at 5.7. Medications reviewed and include octreotide, folic acid, thiamine. Patient required intubation this morning. S/P upper endoscopy today which showed actively bleeding Mallory-Weiss tear with large amount of blood in gastric pouch. Large esophageal varices were also seen.  Patient is currently intubated on ventilator support MV: 12.7 L/min Temp (24hrs), Avg:98.2 F (36.8 C), Min:97.8 F (36.6 C), Max:98.7 F (37.1 C)   Diet Order:  Diet NPO time specified  Skin:  Reviewed, no issues  Last BM:  7/28 (bloody)  Height:   Ht Readings from Last 1 Encounters:  08/22/15 6\' 4"  (1.93 m)    Weight:   Wt Readings from Last 1 Encounters:  08/22/15 230 lb (104.3 kg)    Ideal Body Weight:  91.8 kg  BMI:  Body mass index is 28 kg/m.  Estimated Nutritional Needs:   Kcal:  2271  Protein:  140-160 gm  Fluid:  2.3 L  EDUCATION NEEDS:   No education needs identified at this time  Joaquin Courts, RD, LDN, CNSC Pager [phone] After Hours Pager [phone]

## 2015-08-22 NOTE — Op Note (Signed)
  EGD PROCEDURE NOTE  Meds: see ICU documentation  Indication: GI bleed  Patient intubated prior to procedure for airway protection  Procedure: Endoscope inserted into the oropharynx and the esophagus was intubated revealing medium-large esophageal varices (3 columns) extending from the mid-esophagus to the GEJ. No bleeding stigmata or red wales seen on the esophageal varices. Gastric pouch intubated and a large amount of bright red blood noted. Gastrojejunostomy evaluated and scattered areas of blood products seen in examined area. In the gastric pouch at the GEJ a large Mallory-Weiss tear was noted and was actively oozing blood, which became rapid during the procedure. Epinephrine:saline 1:10,000 U was injected submucosally ( 4 cc total) without benefit and the bleeding became more rapid. Bicap gold probe cautery was used (7 [place] catheter) and initially the bleeding was not stopped and a hemoclip was attempted but the angle and the massive bleeding prevented placement of the hemoclip. The Bicap was reinserted and cautery was again done and hemostasis was achieved at the conclusion of the study. Procedure was terminated. Due to technical difficulties, at this time no photodocumentation of the procedure can be found.  Impression: 1. Bleeding Mallory-Weis tear - hemostasis achieved with Bicap gold probe cautery (see above for details)  2. Esophageal varices (without active bleeding or bleeding stigmata)  Recs: Continue Octreotide drip; Protonix drip; Remain on ventilator for airway protection; If rebleeding occurs, then need embolization by IR or surgery; [facility] will reevaluate tomorrow.

## 2015-08-22 NOTE — Progress Notes (Signed)
Pharmacy Antibiotic Note  Bradley Harris is a 60 y.o. male admitted on 08/22/2015 with GIB and peritonitis.  Pharmacy has been consulted for Rocephin dosing.  Plan: Rocephin 2g IV Q24H.  Height: 6\' 4"  (193 cm) Weight: 230 lb (104.3 kg) IBW/kg (Calculated) : 86.8  Temp (24hrs), Avg:98 F (36.7 C), Min:97.8 F (36.6 C), Max:98.1 F (36.7 C)   Recent Labs Lab 08/22/15 0147 08/22/15 0231  WBC 4.3  --   CREATININE 1.13  --   LATICACIDVEN  --  8.00*    Estimated Creatinine Clearance: 95.7 mL/min (by C-G formula based on SCr of 1.13 mg/dL).    No Known Allergies    Thank you for allowing pharmacy to be a part of this patient's care.  Vernard Gambles, PharmD, BCPS  08/22/2015 6:58 AM

## 2015-08-22 NOTE — Progress Notes (Signed)
Pharmacy Antibiotic Note  Bradley Harris is a 60 y.o. male admitted on 08/22/2015 with GIB and peritonitis, now w/ + blood cx.  Pharmacy has been consulted for Rocephin and Vancocin dosing.  Of note Rocephin was ordered this morning but first dose was never given.  Plan: Vancomycin 2000mg  x1 followed by 1000mg  IV every 8 hours.  Goal trough 15-20 mcg/mL.  Continue Rocephin 2g every 24 hours, to start now.  Height: 6\' 4"  (193 cm) Weight: 230 lb (104.3 kg) IBW/kg (Calculated) : 86.8  Temp (24hrs), Avg:98.3 F (36.8 C), Min:97.8 F (36.6 C), Max:98.7 F (37.1 C)   Recent Labs Lab 08/22/15 0147 08/22/15 0231 08/22/15 0608 08/22/15 0742 08/22/15 1155 08/22/15 1200 08/22/15 1345 08/22/15 1530 08/22/15 2240  WBC 4.3  --  5.1 4.5 7.3  --  9.3 14.2* 13.1*  CREATININE 1.13  --  1.03 1.02  --  0.99  --   --   --   LATICACIDVEN  --  8.00* 8.8* 9.6*  --   --  9.8*  --   --     Results for orders placed or performed during the hospital encounter of 08/22/15  Blood Culture ID Panel (Reflexed) (Collected: 08/22/2015  7:45 AM)  Result Value Ref Range   Enterococcus species DETECTED (A) NOT DETECTED   Vancomycin resistance NOT DETECTED NOT DETECTED   Listeria monocytogenes NOT DETECTED NOT DETECTED   Staphylococcus species NOT DETECTED NOT DETECTED   Staphylococcus aureus NOT DETECTED NOT DETECTED   Methicillin resistance NOT DETECTED NOT DETECTED   Streptococcus species NOT DETECTED NOT DETECTED   Streptococcus agalactiae NOT DETECTED NOT DETECTED   Streptococcus pneumoniae NOT DETECTED NOT DETECTED   Streptococcus pyogenes NOT DETECTED NOT DETECTED   Acinetobacter baumannii NOT DETECTED NOT DETECTED   Enterobacteriaceae species DETECTED (A) NOT DETECTED   Enterobacter cloacae complex NOT DETECTED NOT DETECTED   Escherichia coli DETECTED (A) NOT DETECTED   Klebsiella oxytoca NOT DETECTED NOT DETECTED   Klebsiella pneumoniae DETECTED (A) NOT DETECTED   Proteus species NOT DETECTED  NOT DETECTED   Serratia marcescens NOT DETECTED NOT DETECTED   Carbapenem resistance NOT DETECTED NOT DETECTED   Haemophilus influenzae NOT DETECTED NOT DETECTED   Neisseria meningitidis NOT DETECTED NOT DETECTED   Pseudomonas aeruginosa NOT DETECTED NOT DETECTED   Candida albicans NOT DETECTED NOT DETECTED   Candida glabrata NOT DETECTED NOT DETECTED   Candida krusei NOT DETECTED NOT DETECTED   Candida parapsilosis NOT DETECTED NOT DETECTED   Candida tropicalis NOT DETECTED NOT DETECTED    Name of physician (or Provider) Contacted: Dr E Deterding  Changes to prescribed antibiotics required: Add vanc for enterococcus coverage; CTX already ordered which covers E.coli and Klebsiella  Estimated Creatinine Clearance: 109.2 mL/min (by C-G formula based on SCr of 0.99 mg/dL).    No Known Allergies  Antimicrobials this admission: Rocephin 7/28 >>  Vancomycin 7/28 >>    Microbiology results: 7/28 Blood Cx >> see above 7/28 Urine >> 7/28 Resp Cx >>  7/28 PCR >> neg  Thank you for allowing pharmacy to be a part of this patient's care.  Vernard Gambles, PharmD, BCPS  08/22/2015 11:17 PM

## 2015-08-22 NOTE — ED Provider Notes (Signed)
[facility]-EMERGENCY DEPT Provider Note   CSN: [id number] Arrival date & time: 08/22/15  0133  First Provider Contact:  None    By signing my name below, I, Octavia Heir, attest that this documentation has been prepared under the direction and in the presence of Dione Booze, MD.  Electronically Signed: Octavia Heir, ED Scribe. 08/22/15. 2:09 AM.    History   Chief Complaint Chief Complaint  Patient presents with  . GI Bleeding    The history is provided by the patient. No language interpreter was used.   HPI Comments: Bradley Harris is a 60 y.o. male who alcoholism, depression, DM, hypercholesterolemia, neuropathy, and gastric bypass surgery presents to the Emergency Department complaining of a sudden onset, gradual worsening, moderate hematemesis onset one day ago. He reports associated melena, dizziness, nausea and weakness. pt states having hematemesis of bright red blood onset 1 day ago ~ 12 times and uncontrolled melena that he notes started today. Pt reports drinking one bottle of bourbon a day which has been happening for the past few weeks. Pt says he has had this before and calls it an "alcohol induced GI bleed". Denies abdominal pain and has not been diagnosed with cirrhosis.   Past Medical History:  Diagnosis Date  . Alcoholism (HCC)   . Depression   . Diabetes mellitus without complication (HCC)   . Hypercholesterolemia   . Neuropathy (HCC)     There are no active problems to display for this patient.   History reviewed. No pertinent surgical history.     Home Medications    Prior to Admission medications   Not on File    Family History No family history on file.  Social History Social History  Substance Use Topics  . Smoking status: Never Smoker  . Smokeless tobacco: Not on file  . Alcohol use Yes     Allergies   Review of patient's allergies indicates not on file.   Review of Systems Review of Systems  Gastrointestinal: Positive for nausea  and vomiting. Negative for abdominal pain.       Melena  Neurological: Positive for weakness.  All other systems reviewed and are negative.    Physical Exam Updated Vital Signs BP (!) 84/62 (BP Location: Right Arm)   Pulse 116   Temp 97.8 F (36.6 C) (Oral)   Ht 6\' 4"  (1.93 m)   Wt 230 lb (104.3 kg)   SpO2 99%   BMI 28.00 kg/m   Physical Exam  Constitutional: He is oriented to person, place, and time. He appears well-developed and well-nourished.  HENT:  Head: Normocephalic and atraumatic.  Eyes: EOM are normal. Pupils are equal, round, and reactive to light. No scleral icterus.  Neck: Normal range of motion. Neck supple. No JVD present.  Cardiovascular: Regular rhythm, normal heart sounds and intact distal pulses.  Tachycardia present.   No murmur heard. Mildly tachycardic  Pulmonary/Chest: Effort normal and breath sounds normal. He has no wheezes. He has no rales. He exhibits no tenderness.  Abdominal: Soft. He exhibits no distension and no mass. There is no tenderness.  Bowel sounds decreased  Musculoskeletal: Normal range of motion. He exhibits edema.  Trace PT edema Mild venous stasis changes present Ecchymosis present over both lower legs  Lymphadenopathy:    He has no cervical adenopathy.  Neurological: He is alert and oriented to person, place, and time. No cranial nerve deficit. He exhibits normal muscle tone. Coordination normal.  Skin: Skin is warm and dry. No rash noted.  Psychiatric: He has a normal mood and affect. His behavior is normal. Judgment and thought content normal.  Nursing note and vitals reviewed.    ED Treatments / Results  DIAGNOSTIC STUDIES: Oxygen Saturation is 99% on RA, normal by my interpretation.  COORDINATION OF CARE:  2:08 AM Discussed treatment plan with pt at bedside and pt agreed to plan.  Labs (all labs ordered are listed, but only abnormal results are displayed) Labs Reviewed  CBC WITH DIFFERENTIAL/PLATELET - Abnormal;  Notable for the following:       Result Value   RBC 2.97 (*)    Hemoglobin 8.1 (*)    HCT 25.7 (*)    RDW 17.5 (*)    Platelets 73 (*)    All other components within normal limits  COMPREHENSIVE METABOLIC PANEL - Abnormal; Notable for the following:    CO2 19 (*)    Glucose, Bld 166 (*)    Calcium 8.3 (*)    AST 240 (*)    ALT 93 (*)    Total Bilirubin 2.5 (*)    Anion gap 22 (*)    All other components within normal limits  ETHANOL - Abnormal; Notable for the following:    Alcohol, Ethyl (B) 316 (*)    All other components within normal limits  PROTIME-INR - Abnormal; Notable for the following:    Prothrombin Time 16.9 (*)    All other components within normal limits  I-STAT CG4 LACTIC ACID, ED - Abnormal; Notable for the following:    Lactic Acid, Venous 8.00 (*)    All other components within normal limits  SAMPLE TO BLOOD BANK  TYPE AND SCREEN  ABO/RH    Procedures Procedures (including critical care time) CRITICAL CARE Performed by: NFAOZ,HYQMV Total critical care time: 130 minutes Critical care time was exclusive of separately billable procedures and treating other patients. Critical care was necessary to treat or prevent imminent or life-threatening deterioration. Critical care was time spent personally by me on the following activities: development of treatment plan with patient and/or surrogate as well as nursing, discussions with consultants, evaluation of patient's response to treatment, examination of patient, obtaining history from patient or surrogate, ordering and performing treatments and interventions, ordering and review of laboratory studies, ordering and review of radiographic studies, pulse oximetry and re-evaluation of patient's condition.  Medications Ordered in ED Medications  0.9 %  sodium chloride infusion (1,000 mLs Intravenous New Bag/Given 08/22/15 0211)    Followed by  0.9 %  sodium chloride infusion (1,000 mLs Intravenous New Bag/Given 08/22/15  0303)  pantoprazole (PROTONIX) 80 mg in sodium chloride 0.9 % 250 mL (0.32 mg/mL) infusion (8 mg/hr Intravenous New Bag/Given 08/22/15 0304)  pantoprazole (PROTONIX) injection 40 mg (not administered)  LORazepam (ATIVAN) injection 0-4 mg (not administered)    Followed by  LORazepam (ATIVAN) injection 0-4 mg (not administered)  thiamine (VITAMIN B-1) tablet 100 mg (not administered)    Or  thiamine (B-1) injection 100 mg (not administered)  pantoprazole (PROTONIX) 80 mg in sodium chloride 0.9 % 100 mL IVPB (80 mg Intravenous New Bag/Given 08/22/15 0303)  ondansetron (ZOFRAN) injection 4 mg (4 mg Intravenous Given 08/22/15 0304)  sodium chloride 0.9 % bolus 1,000 mL (1,000 mLs Intravenous New Bag/Given 08/22/15 0326)  metoCLOPramide (REGLAN) injection 10 mg (10 mg Intravenous Given 08/22/15 0339)     Initial Impression / Assessment and Plan / ED Course  I have reviewed the triage vital signs and the nursing notes.  Pertinent labs that were  available during my care of the patient were reviewed by me and considered in my medical decision making (see chart for details).  Clinical Course  Comment By Time  Elevated lactic acid from hypotension, not sepsis. Elevated transaminases and bilirubin consistent with alcoholic liver disease. Anemia - acute on chronic. Thrombocytopenia secondary to alcoholic liver disease. Dione Booze, MD 07/28 [phone]    Upper GI bleed in setting of alcohol abuse. No old records available. Per patient, no history of varices. He is initially very hypotensive but blood pressure did respond to IV fluids. Initial hemoglobin is 8.1, but I do not have a baseline with which to judge that. She will hemoglobins will need to be followed. 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 He is given ondansetron for nausea and started on pantoprazole drip for upper GI bleed. Nausea is only partly controlled and was given metoclopramide for nausea. I  discussed case with Dr. Dulce Sellar of gastroenterology removals see the patient in consultation. At this point, no need for emergent endoscopy.Case is discussed with Dr. Clyde Lundborg of [facility] agrees to admit the patient to stepdown unit under observation status.  Final Clinical Impressions(s) / ED Diagnoses   Final diagnoses:  Upper gastrointestinal bleeding  Alcohol intoxication, uncomplicated (HCC)  Thrombocytopenia (HCC)  Elevated liver enzymes  Elevated lactic acid level   I personally performed the services described in this documentation, which was scribed in my presence. The recorded information has been reviewed and is accurate.    New Prescriptions New Prescriptions   No medications on file     Dione Booze, MD 08/22/15 [phone]

## 2015-08-22 NOTE — ED Notes (Signed)
HGB of 6.3 reported to Dr. Isaiah Serge. Pt to receive 2units of PRBC

## 2015-08-22 NOTE — ED Notes (Signed)
CCM at bedside 

## 2015-08-22 NOTE — ED Notes (Signed)
Attempting to contact CCM due to bed order for ICU was not placed.

## 2015-08-22 NOTE — Consult Note (Addendum)
PULMONARY / CRITICAL CARE MEDICINE   Name: Bradley Harris MRN: [id number] DOB: [birth year]    ADMISSION DATE:  08/22/2015 CONSULTATION DATE:  08/22/15  REFERRING MD:  Lorin Picket  CHIEF COMPLAINT:  Upper GI bleed  HISTORY OF PRESENT ILLNESS:   Bradley Harris is a 60 year old with history of alcoholism, diabetes mellitus, depression, hyperlipidemia. He is admitted today with hematemesis and melena for the past 2 days. He drinks a  bottle of bourbon every day. In the ED he was found to have tachycardia, hypertension which improved after 2 L normal saline. He has H/O ascites that was tapped a few years ago. No known varices, he has never had a EGD.  He was initially admitted to [facility]. However while getting his first unit of blood he stood up and became tachycardic to the 160s. He continues to have hematemesis and melena. PCCM called for admission due to hemodynamic instability.  PAST MEDICAL HISTORY :  He  has a past medical history of Alcoholism (HCC); Depression; Diabetes mellitus without complication (HCC); Hypercholesterolemia; and Neuropathy (HCC).  PAST SURGICAL HISTORY: He  has a past surgical history that includes Gastric bypass surgery; Shoulder surgery; and Lumbar spine surgery.  No Known Allergies  No current facility-administered medications on file prior to encounter.    No current outpatient prescriptions on file prior to encounter.    FAMILY HISTORY:  His indicated that the status of his mother is unknown. He indicated that the status of his father is unknown.    SOCIAL HISTORY: He  reports that he has never smoked. He does not have any smokeless tobacco history on file. He reports that he drinks alcohol. He reports that he does not use drugs.  REVIEW OF SYSTEMS:   Denies any shortness of breath, wheeze, cough, sputum production. Positive for hematemesis, melena, nausea, vomiting. He denies any abdominal pain. Denies any chest pain, palpitation. Denies any fevers,  chills, malaise, loss of weight, loss of appetite. All other review of systems are negative.  SUBJECTIVE:   VITAL SIGNS: BP (!) 84/50   Pulse (!) 161   Temp 98 F (36.7 C) (Oral)   Resp 17   Ht 6\' 4"  (1.93 m)   Wt 230 lb (104.3 kg)   SpO2 99%   BMI 28.00 kg/m   HEMODYNAMICS:   VENTILATOR SETTINGS:   INTAKE / OUTPUT: No intake/output data recorded.  PHYSICAL EXAMINATION: General:  No distress. Neuro:  Awake, alert, oriented, no focal neurologic deficits. HEENT:  Moist mucous membranes, no thyromegaly, JVD Cardiovascular:  Regular rate and rhythm, no murmurs rubs gallops Lungs:  Clear no wheezing, crackles Abdomen:  Distended, soft, positive bowel sounds, no guarding, rigidity Musculoskeletal:  Normal tone and bulk Skin:  Intact, no rash.  LABS:  BMET  Recent Labs Lab 08/22/15 0147  NA 142  K 4.5  CL 101  CO2 19*  BUN 15  CREATININE 1.13  GLUCOSE 166*    Electrolytes  Recent Labs Lab 08/22/15 0147  CALCIUM 8.3*    CBC  Recent Labs Lab 08/22/15 0147  WBC 4.3  HGB 8.1*  HCT 25.7*  PLT 73*    Coag's  Recent Labs Lab 08/22/15 0147  INR 1.36    Sepsis Markers  Recent Labs Lab 08/22/15 0231  LATICACIDVEN 8.00*    ABG No results for input(s): PHART, PCO2ART, PO2ART in the last 168 hours.  Liver Enzymes  Recent Labs Lab 08/22/15 0147  AST 240*  ALT 93*  ALKPHOS 107  BILITOT  2.5*  ALBUMIN 3.5    Cardiac Enzymes No results for input(s): TROPONINI, PROBNP in the last 168 hours.  Glucose No results for input(s): GLUCAP in the last 168 hours.  Imaging No results found.   STUDIES:    CULTURES: Bcx 7/28 > Ucx 7/28> Sputum 7/28>  ANTIBIOTICS: Ceftriaxone 7/28 >  SIGNIFICANT EVENTS:   LINES/TUBES:   DISCUSSION: 60 year old with upper GI bleed, alcohol abuse. . Pt has portal hypertension, ascites but no documented varices. H/O gastric bypass surgery  We will admit to ICU given hemodynamic instability I  think the tachycardia is secondary to ongoing GI bleed and not a transfusion reaction. We will press ahead with the transfusion of 1 unit PRBC Start Protonix, octreotide drips Empiric coverage of SBP with ceftriaxone. He will need GI assessment and assessment with ultrasound for paracentesis.  ASSESSMENT / PLAN:  PULMONARY A: Stable P:   O2 Nasal cannula Check CXR  CARDIOVASCULAR A:  Tachycardia> Likely related to ongoing GI bleed P:  Check EKG, troponins  RENAL A:   Stable P:   Monitor urine output and Cr  GASTROINTESTINAL A:   Upper GI bleed Alcohol abuse H/O gastric bypass P:   Keep NPO GI consulted. Will need EGD Start protonix and octreotide drips Assess for paracentesis Ceftriaxone for empiric coverage of SBP  HEMATOLOGIC A:   Low Hb Thrombocytopenia Active bleed P:  Transfuse 2 units PRBC STAT  INFECTIOUS A:   R/O SBP P:   Ceftriaxone Follow cultures, PCt  ENDOCRINE A:   Diabetes mellitus P:   Hold outpatient insulin while NPO SSI coverge  NEUROLOGIC A:   Stable. No sign of encepahlopathy P:   Monitor for alcohol withdrawal Thiamine, folic acid IV  FAMILY  - Updates: Patient and significant other updated at bedside - Inter-disciplinary family meet or Palliative Care meeting due by: 8/4  Critical care time- 40 mins  Bradley Greathouse MD [facility] [place] [facility] Pager [phone] If no answer or after 3pm call: [phone] 08/22/2015, 7:02 AM

## 2015-08-22 NOTE — H&P (Addendum)
History and Physical    Bradley Harris ZOX:[id number] DOB: 1955/06/28 DOA: 08/22/2015  Referring MD/NP/PA:   PCP: No PCP Per Patient   Patient coming from:  The patient is coming from home.  At baseline, pt is independent for most of ADL.   Chief Complaint: Hematemesis and dark stool  HPI: Bradley Harris is a 60 y.o. male with medical history significant of alcohol abuse, diabetes mellitus, depression, hyperlipidemia, who presents with hematemesis and dark stool.  Patient reports that he had 12 times of hematemesis with small amount of bright red blood today. He also had 2 episode of dark stool with large amount of black clots. He has generalized weakness and lightheadedness. He denies chest pain, shortness of breath or palpitation. No abdominal pain. Patient denies symptoms of UTI or unilateral weakness.  ED Course: pt was found to have tachycardia and hypotension with blood pressure 83/51, which improved to 97/61 after 2 L of normal saline bolus. Hemoglobin 8.1 (no previous hemoglobin data available), lactate 8.0, WBC 4.3, platelet 73, electrolytes and renal function okay, alcohol level 316, INR 1.36. Abnormal liver function with total bilirubin 2.5, AST 240, ALT 93 and ALP 107. Pt is placed on SDU for obs.GI, Dr. Dulce Sellar was consulted by EDP.  Review of Systems:   General: no fevers, chills, no changes in body weight, has fatigue and lightheadedness.  HEENT: no blurry vision, hearing changes or sore throat Pulm: no dyspnea, coughing, wheezing CV: no chest pain, no palpitations Abd: no nausea, abdominal pain, diarrhea, constipation. Has hematemesis and dark stool. GU: no dysuria, burning on urination, increased urinary frequency, hematuria  Ext: no leg edema Neuro: no unilateral weakness, numbness, or tingling, no vision change or hearing loss Skin: no rash MSK: No muscle spasm, no deformity, no limitation of range of movement in spin Heme: No easy bruising.  Travel history: No  recent long distant travel.  Allergy: No Known Allergies  Past Medical History:  Diagnosis Date  . Alcoholism (HCC)   . Depression   . Diabetes mellitus without complication (HCC)   . Hypercholesterolemia   . Neuropathy [facility])     Past Surgical History:  Procedure Laterality Date  . Gastric bypass surgery    . LUMBAR SPINE SURGERY    . SHOULDER SURGERY      Social History:  reports that he has never smoked. He does not have any smokeless tobacco history on file. He reports that he drinks alcohol. He reports that he does not use drugs.  Family History:  Family History  Problem Relation Age of Onset  . Diabetes Mellitus I Mother   . Dementia Father      Prior to Admission medications   Medication Sig Start Date End Date Taking? Authorizing Provider  busPIRone (BUSPAR) 5 MG tablet Take 5 mg by mouth 2 (two) times daily.   Yes Historical Provider, MD  citalopram (CELEXA) 20 MG tablet Take 20 mg by mouth daily.   Yes Historical Provider, MD  gabapentin (NEURONTIN) 300 MG capsule Take 300 mg by mouth 3 (three) times daily.   Yes Historical Provider, MD  insulin aspart (NOVOLOG) 100 UNIT/ML injection Inject 1-10 Units into the skin daily as needed for high blood sugar.   Yes Historical Provider, MD  insulin aspart protamine- aspart (NOVOLOG MIX 70/30) (70-30) 100 UNIT/ML injection Inject 10-16 Units into the skin 2 (two) times daily with a meal. Use 16 units every morning and use 10 units every night   Yes Historical Provider, MD  omeprazole (PRILOSEC) 20 MG capsule Take 20 mg by mouth 2 (two) times daily before a meal.   Yes Historical Provider, MD  simvastatin (ZOCOR) 20 MG tablet Take 20 mg by mouth daily.   Yes Historical Provider, MD    Physical Exam: Vitals:   08/22/15 0300 08/22/15 0315 08/22/15 0349 08/22/15 0524  BP: 111/67 97/61 (!) 97/45 (!) 84/44  Pulse: 114 112 (!) 124 118  Resp:  16  18  Temp:    98.1 F (36.7 C)  TempSrc:    Oral  SpO2: 95% 100%  100%  Weight:       Height:       General: Not in acute distress. Pale looking HEENT:       Eyes: PERRL, EOMI, no scleral icterus.       ENT: No discharge from the ears and nose, no pharynx injection, no tonsillar enlargement.        Neck: No JVD, no bruit, no mass felt. Heme: No neck lymph node enlargement. Cardiac: S1/S2, RRR,Tachycardia, No murmurs, No gallops or rubs. Pulm: No rales, wheezing, rhonchi or rubs. Abd: Soft, nondistended, nontender, no rebound pain, no organomegaly, BS present. GU: No hematuria Ext: No pitting leg edema bilaterally. 2+DP/PT pulse bilaterally. Musculoskeletal: No joint deformities, No joint redness or warmth, no limitation of ROM in spin. Skin: No rashes.  Neuro: Alert, oriented X3, cranial nerves II-XII grossly intact, moves all extremities normally. Psych: Patient is not psychotic, no suicidal or hemocidal ideation.  Labs on Admission: I have personally reviewed following labs and imaging studies  CBC:  Recent Labs Lab 08/22/15 0147  WBC 4.3  NEUTROABS 2.8  HGB 8.1*  HCT 25.7*  MCV 86.5  PLT 73*   Basic Metabolic Panel:  Recent Labs Lab 08/22/15 0147  NA 142  K 4.5  CL 101  CO2 19*  GLUCOSE 166*  BUN 15  CREATININE 1.13  CALCIUM 8.3*   GFR: Estimated Creatinine Clearance: 95.7 mL/min (by C-G formula based on SCr of 1.13 mg/dL). Liver Function Tests:  Recent Labs Lab 08/22/15 0147  AST 240*  ALT 93*  ALKPHOS 107  BILITOT 2.5*  PROT 6.5  ALBUMIN 3.5   No results for input(s): LIPASE, AMYLASE in the last 168 hours. No results for input(s): AMMONIA in the last 168 hours. Coagulation Profile:  Recent Labs Lab 08/22/15 0147  INR 1.36   Cardiac Enzymes: No results for input(s): CKTOTAL, CKMB, CKMBINDEX, TROPONINI in the last 168 hours. BNP (last 3 results) No results for input(s): PROBNP in the last 8760 hours. HbA1C: No results for input(s): HGBA1C in the last 72 hours. CBG: No results for input(s): GLUCAP in the last 168  hours. Lipid Profile: No results for input(s): CHOL, HDL, LDLCALC, TRIG, CHOLHDL, LDLDIRECT in the last 72 hours. Thyroid Function Tests: No results for input(s): TSH, T4TOTAL, FREET4, T3FREE, THYROIDAB in the last 72 hours. Anemia Panel: No results for input(s): VITAMINB12, FOLATE, FERRITIN, TIBC, IRON, RETICCTPCT in the last 72 hours. Urine analysis: No results found for: COLORURINE, APPEARANCEUR, LABSPEC, PHURINE, GLUCOSEU, HGBUR, BILIRUBINUR, KETONESUR, PROTEINUR, UROBILINOGEN, NITRITE, LEUKOCYTESUR Sepsis Labs: (procalcitonin:4,lacticidven:4) )No results found for this or any previous visit (from the past 240 hour(s)).   Radiological Exams on Admission: No results found.   EKG: Independently reviewed.  Not done in ED, will get one.   Assessment/Plan Principal Problem:   GIB (gastrointestinal bleeding) Active Problems:   Alcoholism (HCC)   Depression   Diabetes mellitus without complication (HCC)   Hypercholesterolemia  Neuropathy (HCC)   Elevated lactic acid level   GIB (gastrointestinal bleeding): Hgb 8.1. Likely due to upper GI bleeding. May be due to alcoholic gastritis, other possibilities also exist, such as PUD, liver cirrhosis and esophageal varices bleeding given abnormal liver function and history of heavy drinking. His hypotension responded to fluid resuscitation, currently hemodynamically stable. GI, Dr. Dulce Sellar was consulted, will see pt in AM.  - will admit to SDU for obs - GI consulted by Ed, will follow up recommendations - NPO for possible EGD - IVF: 3L NS and then 100 mL/hr - Start IV pantoprazole gtt and Octreotide infusion, - Zofran IV for nausea - Avoid NSAIDs and SQ heparin - Maintain IV access (2 large bore IVs if possible). - Monitor closely and follow q6h cbc, transfuse as necessary. - LaB: INR, PTT and type screen -transfuse 1 unit of blood now  Addendum: RN report that soon after blood transfusion started, pt stood up and felt very  uncomfortable. His HR is up to 160s. He had another bowel movement with large amount of dark stool. His bp dropped to SBP 85. He is oriented x 3. Not sure whether this is due to blood transfusion reaction all because of more GIB.  -Hold blood transfusion now. -check CBC stat -Continue NS bolus, 1L x 1 now. -I called GI, Dr. Cleda Clarks, who recommended to transfer pt to ICU. -I called PCCM and consulted Dr. Darrick Penna. -will send blood sample to blood bank and urine sample for analysis  Alcohol abuse: -Did counseling about the importance of quitting drinking -CIWA protocol  DM-II: Last A1c not on  Record. Patient is taking NovoLog and 70/30 mixed insulin at home -will decrease 70/30 insulin dose from 16-10 U in AM and PM to 10 U in AM and 6 U in PM -SSI -Check A1c  HLD: Last LDL was not on record -Continue home medications: zocor  Elevated lactic acid level: lactic acid is 8.0 on admission. No signs of infection. Most likely due to hypoperfusion secondary to hypotension. -Fluid resuscitation and a blood transfusion as above -Trend lactic acid levels  Depression: Stable, no suicidal or homicidal ideations. -Continue home medications: BuSpar, Celexa   DVT ppx: SCD Code Status: Full code Family Communication:  Yes, patient's friend, Ms. Georgia Lopes [phone]) at bed side Disposition Plan:  Anticipate discharge back to previous home environment Consults called: GI, Dr. Dulce Sellar Admission status: SDU/obs  Date of Service 08/22/2015    Lorretta Harp [facility] Pager [phone]  If 7PM-7AM, please contact night-coverage [email or web address] Password [facility] 08/22/2015, 5:31 AM

## 2015-08-23 ENCOUNTER — Inpatient Hospital Stay (HOSPITAL_COMMUNITY): Payer: Self-pay

## 2015-08-23 ENCOUNTER — Encounter (HOSPITAL_COMMUNITY): Payer: Self-pay | Admitting: Gastroenterology

## 2015-08-23 DIAGNOSIS — I8500 Esophageal varices without bleeding: Secondary | ICD-10-CM

## 2015-08-23 DIAGNOSIS — D696 Thrombocytopenia, unspecified: Secondary | ICD-10-CM

## 2015-08-23 DIAGNOSIS — E872 Acidosis: Secondary | ICD-10-CM

## 2015-08-23 DIAGNOSIS — F10120 Alcohol abuse with intoxication, uncomplicated: Secondary | ICD-10-CM

## 2015-08-23 DIAGNOSIS — F10929 Alcohol use, unspecified with intoxication, unspecified: Secondary | ICD-10-CM

## 2015-08-23 DIAGNOSIS — K766 Portal hypertension: Secondary | ICD-10-CM

## 2015-08-23 DIAGNOSIS — R748 Abnormal levels of other serum enzymes: Secondary | ICD-10-CM

## 2015-08-23 LAB — URINALYSIS W MICROSCOPIC (NOT AT ARMC)
BACTERIA UA: NONE SEEN
Glucose, UA: NEGATIVE mg/dL
Ketones, ur: 15 mg/dL — AB
Nitrite: NEGATIVE
PH: 5.5 (ref 5.0–8.0)
Protein, ur: NEGATIVE mg/dL
SPECIFIC GRAVITY, URINE: 1.02 (ref 1.005–1.030)

## 2015-08-23 LAB — TYPE AND SCREEN
ABO/RH(D): O POS
ANTIBODY SCREEN: NEGATIVE
UNIT DIVISION: 0
Unit division: 0
Unit division: 0

## 2015-08-23 LAB — COMPREHENSIVE METABOLIC PANEL
ALK PHOS: 64 U/L (ref 38–126)
ALT: 531 U/L — AB (ref 17–63)
AST: 1884 U/L — AB (ref 15–41)
Albumin: 2.5 g/dL — ABNORMAL LOW (ref 3.5–5.0)
Anion gap: 16 — ABNORMAL HIGH (ref 5–15)
BILIRUBIN TOTAL: 4.2 mg/dL — AB (ref 0.3–1.2)
BUN: 25 mg/dL — AB (ref 6–20)
CALCIUM: 6.3 mg/dL — AB (ref 8.9–10.3)
CO2: 20 mmol/L — ABNORMAL LOW (ref 22–32)
CREATININE: 1.6 mg/dL — AB (ref 0.61–1.24)
Chloride: 107 mmol/L (ref 101–111)
GFR calc Af Amer: 54 mL/min — ABNORMAL LOW (ref >60)
GFR, EST NON AFRICAN AMERICAN: 46 mL/min — AB (ref >60)
Glucose, Bld: 201 mg/dL — ABNORMAL HIGH (ref 65–99)
POTASSIUM: 4.6 mmol/L (ref 3.5–5.1)
Sodium: 143 mmol/L (ref 135–145)
TOTAL PROTEIN: 4.7 g/dL — AB (ref 6.5–8.1)

## 2015-08-23 LAB — POCT I-STAT 3, ART BLOOD GAS (G3+)
ACID-BASE DEFICIT: 3 mmol/L — AB (ref 0.0–2.0)
Bicarbonate: 20.3 mEq/L (ref 20.0–24.0)
O2 SAT: 98 %
PH ART: 7.47 — AB (ref 7.350–7.450)
Patient temperature: 99.6
TCO2: 21 mmol/L (ref 0–100)
pCO2 arterial: 28.1 mmHg — ABNORMAL LOW (ref 35.0–45.0)
pO2, Arterial: 96 mmHg (ref 80.0–100.0)

## 2015-08-23 LAB — GLUCOSE, CAPILLARY
GLUCOSE-CAPILLARY: 235 mg/dL — AB (ref 65–99)
GLUCOSE-CAPILLARY: 211 mg/dL — AB (ref 65–99)
GLUCOSE-CAPILLARY: 200 mg/dL — AB (ref 65–99)
Glucose-Capillary: 197 mg/dL — ABNORMAL HIGH (ref 65–99)
Glucose-Capillary: 205 mg/dL — ABNORMAL HIGH (ref 65–99)
Glucose-Capillary: 240 mg/dL — ABNORMAL HIGH (ref 65–99)

## 2015-08-23 LAB — CBC
HCT: 23.5 % — ABNORMAL LOW (ref 39.0–52.0)
HCT: 22.3 % — ABNORMAL LOW (ref 39.0–52.0)
HCT: 20.3 % — ABNORMAL LOW (ref 39.0–52.0)
HEMOGLOBIN: 7.4 g/dL — AB (ref 13.0–17.0)
Hemoglobin: 7 g/dL — ABNORMAL LOW (ref 13.0–17.0)
Hemoglobin: 6.3 g/dL — CL (ref 13.0–17.0)
MCH: 27.5 pg (ref 26.0–34.0)
MCH: 27.5 pg (ref 26.0–34.0)
MCH: 27.2 pg (ref 26.0–34.0)
MCHC: 31.5 g/dL (ref 30.0–36.0)
MCHC: 31.4 g/dL (ref 30.0–36.0)
MCHC: 31 g/dL (ref 30.0–36.0)
MCV: 87.4 fL (ref 78.0–100.0)
MCV: 87.5 fL (ref 78.0–100.0)
MCV: 87.5 fL (ref 78.0–100.0)
PLATELETS: 43 10*3/uL — AB (ref 150–400)
PLATELETS: 29 10*3/uL — AB (ref 150–400)
Platelets: 59 10*3/uL — ABNORMAL LOW (ref 150–400)
RBC: 2.69 MIL/uL — ABNORMAL LOW (ref 4.22–5.81)
RBC: 2.55 MIL/uL — ABNORMAL LOW (ref 4.22–5.81)
RBC: 2.32 MIL/uL — ABNORMAL LOW (ref 4.22–5.81)
RDW: 16.6 % — ABNORMAL HIGH (ref 11.5–15.5)
RDW: 16.6 % — AB (ref 11.5–15.5)
RDW: 16.8 % — AB (ref 11.5–15.5)
WBC: 11.7 10*3/uL — ABNORMAL HIGH (ref 4.0–10.5)
WBC: 9 10*3/uL (ref 4.0–10.5)
WBC: 5.1 10*3/uL (ref 4.0–10.5)

## 2015-08-23 LAB — HEMOGLOBIN A1C
Hgb A1c MFr Bld: 7.2 % — ABNORMAL HIGH (ref 4.8–5.6)
MEAN PLASMA GLUCOSE: 160 mg/dL

## 2015-08-23 LAB — PROTIME-INR
INR: 2.11
PROTHROMBIN TIME: 24 s — AB (ref 11.4–15.2)

## 2015-08-23 LAB — PROCALCITONIN: PROCALCITONIN: 2.08 ng/mL

## 2015-08-23 LAB — LACTIC ACID, PLASMA
Lactic Acid, Venous: 8.1 mmol/L (ref 0.5–1.9)
Lactic Acid, Venous: 6.1 mmol/L (ref 0.5–1.9)
Lactic Acid, Venous: 4.2 mmol/L (ref 0.5–1.9)

## 2015-08-23 LAB — PREPARE RBC (CROSSMATCH)

## 2015-08-23 LAB — MAGNESIUM: MAGNESIUM: 1 mg/dL — AB (ref 1.7–2.4)

## 2015-08-23 MED ORDER — PIPERACILLIN-TAZOBACTAM 3.375 G IVPB
3.3750 g | Freq: Three times a day (TID) | INTRAVENOUS | Status: DC
  Administered 2015-08-23 – 2015-08-25 (×5): 3.375 g via INTRAVENOUS
  Filled 2015-08-23 (×7): qty 50

## 2015-08-23 MED ORDER — PIPERACILLIN-TAZOBACTAM 3.375 G IVPB 30 MIN
3.3750 g | Freq: Once | INTRAVENOUS | Status: AC
  Administered 2015-08-23: 3.375 g via INTRAVENOUS
  Filled 2015-08-23: qty 50

## 2015-08-23 MED ORDER — SODIUM CHLORIDE 0.9 % IV SOLN
1.0000 g | Freq: Once | INTRAVENOUS | Status: AC
  Administered 2015-08-23: 1 g via INTRAVENOUS
  Filled 2015-08-23 (×2): qty 10

## 2015-08-23 MED ORDER — LACTULOSE ENEMA
300.0000 mL | Freq: Once | ORAL | Status: AC
  Administered 2015-08-23: 300 mL via RECTAL
  Filled 2015-08-23: qty 300

## 2015-08-23 MED ORDER — SODIUM CHLORIDE 0.9 % IV SOLN
Freq: Once | INTRAVENOUS | Status: AC
  Administered 2015-08-23: 18:00:00 via INTRAVENOUS

## 2015-08-23 MED ORDER — SODIUM CHLORIDE 0.9 % IV SOLN
Freq: Once | INTRAVENOUS | Status: AC
Start: 2015-08-23 — End: 2015-08-23
  Administered 2015-08-23: 10:00:00 via INTRAVENOUS

## 2015-08-23 MED ORDER — DEXTROSE 5 % IV SOLN
10.0000 mg | Freq: Once | INTRAVENOUS | Status: AC
  Administered 2015-08-23: 10 mg via INTRAVENOUS
  Filled 2015-08-23: qty 1

## 2015-08-23 NOTE — Progress Notes (Signed)
CRITICAL VALUE ALERT  Critical value received:  Ca 6.3  Date of notification: 08/23/15  Time of notification:  0411  Critical value read back:Yes.    Nurse who received alert:     MD notified (1st page):   Time of first page:    MD notified (2nd page):  Time of second page:  Responding MD:  Dr. Darrick Penna  Time MD responded:  [phone]

## 2015-08-23 NOTE — Progress Notes (Signed)
PULMONARY / CRITICAL CARE MEDICINE   Name: Bradley Harris MRN: [id number] DOB: 1955/02/11    ADMISSION DATE:  08/22/2015 CONSULTATION DATE:  08/22/15  REFERRING MD:  Lorin Picket  CHIEF COMPLAINT:  Upper GI bleed  HISTORY OF PRESENT ILLNESS:   Bradley Harris is a 60 year old with history of alcoholism, diabetes mellitus, depression, hyperlipidemia. He is admitted today with hematemesis and melena for the past 2 days. He drinks a  bottle of bourbon every day. In the ED he was found to have tachycardia, hypertension which improved after 2 L normal saline. He has H/O ascites that was tapped a few years ago. No known varices, he has never had a EGD.  ->He was initially admitted to [facility] and SDU. However while getting his first unit of blood he stood up and became tachycardic to the 160s. He continues to have hematemesis and melena. PCCM called for admission due to hemodynamic instability.  SUBJECTIVE:  On bicarb gtt overnight after ABG showing pH 7.096. pH improved to 7.47 and bicarb gtt was stopped.  VITAL SIGNS: BP 111/67   Pulse (!) 112   Temp 98.6 F (37 C)   Resp (!) 24   Ht  (1.93 m)   Wt 110.9 kg (244 lb 6.4 oz)   SpO2 98%   BMI 29.75 kg/m   HEMODYNAMICS: CVP:  [6 mmHg-40 mmHg] 6 mmHg VENTILATOR SETTINGS: Vent Mode: PRVC FiO2 (%):  [40 %-50 %] 40 % Set Rate:  [18 bmp-24 bmp] 24 bmp Vt Set:  [700 mL] 700 mL PEEP:  [5 cmH20] 5 cmH20 Plateau Pressure:  [16 cmH20-19 cmH20] 17 cmH20 INTAKE / OUTPUT: I/O last 3 completed shifts: In: 8047.5 [P.O.:2030; I.V.:4755.5; Blood:602; IV Piggyback:660] Out: 1415 [Urine:1140; Stool:275]  PHYSICAL EXAMINATION: General: sedated on vent  Neuro: sleepy, opens eyes to voice HEENT:  Moist mucous membranes Cardiovascular:  RRR, no murmurs rubs gallops Lungs:  Clear bilaterally, no wheezing, crackles, comfortable work of breathing Abdomen:  Distended, soft, positive bowel sounds, no guarding, rigidity Musculoskeletal:  Normal tone and  bulk Skin:  Intact, no rash.  LABS:  BMET  Recent Labs Lab 08/22/15 0742 08/22/15 1200 08/23/15 0259  NA 144 145 143  K 5.6* 5.7* 4.6  CL 110 111 107  CO2 12* 14* 20*  BUN 17 17 25*  CREATININE 1.02 0.99 1.60*  GLUCOSE 163* 161* 201*    Electrolytes  Recent Labs Lab 08/22/15 0742 08/22/15 1200 08/23/15 0259  CALCIUM 7.2* 7.2* 6.3*  MG 1.5*  --   --   PHOS 5.4*  --   --     CBC  Recent Labs Lab 08/22/15 1530 08/22/15 2240 08/23/15 0259  WBC 14.2* 13.1* 11.7*  HGB 8.7* 7.9* 7.4*  HCT 28.3* 25.6* 23.5*  PLT 80* 74* 59*    Coag's  Recent Labs Lab 08/22/15 0147 08/22/15 0742 08/23/15 0259  APTT  --  28  --   INR 1.36 1.80 2.11    Sepsis Markers  Recent Labs Lab 08/22/15 0742 08/22/15 1345 08/23/15 0259  LATICACIDVEN 9.6* 9.8* 8.1*  PROCALCITON 0.18  --  2.08    ABG  Recent Labs Lab 08/22/15 1828 08/22/15 1943 08/23/15 0328  PHART 7.145* 7.278* 7.470*  PCO2ART 33.5* 27.2* 28.1*  PO2ART 165.0* 177.0* 96.0    Liver Enzymes  Recent Labs Lab 08/22/15 0608 08/22/15 0742 08/23/15 0259  AST 304* 763* 1,884*  ALT 114* 282* 531*  ALKPHOS 99 84 64  BILITOT 2.4* 2.6* 4.2*  ALBUMIN 3.4* 2.9* 2.5*  Cardiac Enzymes  Recent Labs Lab 08/22/15 0742  TROPONINI <0.03    Glucose  Recent Labs Lab 08/22/15 0906 08/22/15 1111 08/22/15 1520 08/22/15 1958 08/23/15 0016 08/23/15 0340  GLUCAP 142* 151* 189* 202* 197* 205*    Imaging Dg Chest Port 1 View  Result Date: 08/22/2015 CLINICAL DATA:  Status post intubation and central line placement today. EXAM: PORTABLE CHEST 1 VIEW COMPARISON:  Single-view of the chest earlier today. FINDINGS: Endotracheal tube is in place with the tip in good position 4 cm above the carina. Right IJ catheter tip projects in the lower superior vena cava. Negative for pneumothorax. Lungs are clear. Heart size is normal. No pleural effusion. IMPRESSION: ETT and right IJ catheter projecting good position.  Negative for pneumothorax or acute disease. Electronically Signed   By: Drusilla Kanner M.D.   On: 08/22/2015 11:31  Dg Chest Port 1 View  Result Date: 08/22/2015 CLINICAL DATA:  Acute respiratory failure. EXAM: PORTABLE CHEST 1 VIEW COMPARISON:  None. FINDINGS: The heart size and mediastinal contours are within normal limits. Both lungs are clear. The visualized skeletal structures are unremarkable. IMPRESSION: No active disease. Electronically Signed   By: Ted Mcalpine M.D.   On: 08/22/2015 07:38    STUDIES:  EGD 7/28 >> bleeding Mallory-Weiss tear (cauterized); esophageal varices (not actively bleeding)  CULTURES: Bcx 7/28 > GNR Ucx 7/28> Sputum 7/28>  ANTIBIOTICS: Ceftriaxone 7/28 >  SIGNIFICANT EVENTS: 7/28 >>  Admitted to ICU for hemodynamic instability. 7/28 >> intubated to secure airway for EGD  LINES/TUBES: PIV 7/28 >  ETT 7/28 > CVC R IJ 7/28 >  Foley 7/28 >    DISCUSSION: 60 year old with upper GI bleed, alcohol abuse.Pt has portal hypertension, ascites but no documented varices. H/O gastric bypass surgery. Now admitted w/ working dx of Upper GI bleed. His transfusion was initially held d/t tachycardia felt possibly from transfusion reaction (think more likely this is on-going bleeding). He is on Protonix & octreotide drips, Empiric coverage of SBP with ceftriaxone. He is now in shock since arrival to the ICU. We will resume transfusion, add levophed for MAP >65, place large bore CVL and intubate when BP improved to secure airway for EGD. He has declined significantly since admission. Also at risk for significant w/d given his daily ETOH consumption. Some of his tachycardia may also be explained by this.    ASSESSMENT / PLAN:  PULMONARY A: Intubated for airway protection Aspiration risk P:   Full vent support PAD protocol Will assess daily for readiness to wean  Drop rate  CARDIOVASCULAR A:  Tachycardia> Likely related to ongoing GI bleed plus  alcohol withdrawal Hypovolemic/hemorrhagic shock- s/p 4 units pRBC P:  Levophed for MAP > 55, wean as able Continue Vasopressin Levophed Cortisol level wnl cvp 11 likely needs a line  RENAL A:   Lactic acidosis in setting of GIB and organ hypoperfusion- resolved with bicarb gtt Hyperkalemia- resolved Hypomagnesemia  ARf ATN P:   Monitor urine output and Cr Daily electrolytes Dc bicarb cvp 11 kvo Repeat lactic Avoid gross overload volume, wold prefer products when needed  GASTROINTESTINAL A:   Upper GI bleed 2/2 mallory-weiss tear Alcohol abuse H/O gastric bypass Shock liver H/o ascites  ? Cirrhosis given prior paracentesis in hx P:   Keep NPO Cont protonix and octreotide drips per GI If he rebleeds, will need embolization by IR or surgery Ceftriaxone for empiric coverage of SBP  HEMATOLOGIC A:   Acute blood loss anemia 2/2 upper GI bleed Thrombocytopenia-  related to chronic alcohol usem, dilution Mild coagulopathy (liver, mods, consumption) DVT prophylaxis P:  S/p transfusion pRBCs Serial CBCs SCDs Lactic repeat Repeat vit K , low threhsold for further ffp  INFECTIOUS A:   R/O SBP Blood cultures growing GNRs PCT 0.18 > 2.08 P:   Ceftriaxone Obtain UA, urine culture Follow blood cultures  ENDOCRINE A:   Diabetes mellitus P:   Hold outpatient insulin while NPO SSI coverage  NEUROLOGIC A:   Heavy h/o ETOH and at high risk for withdrawal High high high risk enceph hepatic P:   Monitor for alcohol withdrawal Thiamine, folic acid IV fent drip Lactulose rectal start   Willadean Carol, MD Family Medicine, PGY-2  08/23/2015, 7:18 AM   STAFF NOTE: I, Rory Percy, MD FACP have personally reviewed patient's available data, including medical history, events of note, physical examination and test results as part of my evaluation. I have discussed with resident/NP and other care providers such as pharmacist, RN and RRT. In addition, I personally  evaluated patient and elicited key findings of: awakens, confused, reduced BS, abdo distended, NT, ABg is improved reduce rate further, hold SBT until hemodynamics improve, dc bicarb, pcxr for volume status, if pressors improve sig then sbt likely , may need a line, cvp 11, kvo , would prefer products when needed for resus, repeat lactic, cbc seriel, ARF ATN, allow pos balance but not diluting 7 liters crystalloid today, NPO, lactulose frmo below high risk and has hep enceph, surgery following, ffp for inr, vit k, may need plat if clinically re bleed noted, WUA, fent, no benzo, thiamine, folic, SBP prevention The patient is critically ill with multiple organ systems failure and requires high complexity decision making for assessment and support, frequent evaluation and titration of therapies, application of advanced monitoring technologies and extensive interpretation of multiple databases.   Critical Care Time devoted to patient care services described in this note is35 Minutes. This time reflects time of care of this signee: Rory Percy, MD FACP. This critical care time does not reflect procedure time, or teaching time or supervisory time of PA/NP/Med student/Med Resident etc but could involve care discussion time. Rest per NP/medical resident whose note is outlined above and that I agree with   Mcarthur Rossetti. Tyson Alias, MD, FACP Pgr: [phone] [facility] 08/23/2015 9:00 AM

## 2015-08-23 NOTE — Progress Notes (Signed)
CRITICAL VALUE ALERT  Critical value received:  hgb 6.3 & platelets 29  Date of notification:  08/23/15  Time of notification:  1723  Critical value read back:Yes.    Nurse who received alert:  Lorin Picket  MD notified (1st page):  Mayo  Time of first page:  1737  MD notified (2nd page):  Time of second page:  Responding MD:  Mayo  Time MD responded:  [phone]

## 2015-08-23 NOTE — Progress Notes (Signed)
[facility] Progress Note  Subjective: Still passing moderately large amounts of dark blood rectally, none orally. Still only the fact that dose reduced. Tachycardic at 116.   Objective: Vital signs in last 24 hours: Temp:  [98.6 F (37 C)-100.2 F (37.9 C)] 100.1 F (37.8 C) (07/29 1044) Pulse Rate:  [80-132] 122 (07/29 1044) Resp:  [10-25] 17 (07/29 1044) BP: (81-151)/(51-86) 112/59 (07/29 1044) SpO2:  [93 %-100 %] 95 % (07/29 1044) FiO2 (%):  [40 %-50 %] 40 % (07/29 0905) Weight:  [110.9 kg (244 lb 6.4 oz)] 110.9 kg (244 lb 6.4 oz) (07/29 0500) Weight change: 6.532 kg (14 lb 6.4 oz)   PE: Intubated, nurses currently cleaning him.  Lab Results: Results for orders placed or performed during the hospital encounter of 08/22/15 (from the past 24 hour(s))  Glucose, capillary     Status: Abnormal   Collection Time: 08/22/15 11:11 AM  Result Value Ref Range   Glucose-Capillary 151 (H) 65 - 99 mg/dL   Comment 1 Notify RN    Comment 2 Document in Chart   CBC     Status: Abnormal   Collection Time: 08/22/15 11:55 AM  Result Value Ref Range   WBC 7.3 4.0 - 10.5 K/uL   RBC 2.60 (L) 4.22 - 5.81 MIL/uL   Hemoglobin 7.1 (L) 13.0 - 17.0 g/dL   HCT 95.2 (L) 84.1 - 32.4 %   MCV 89.6 78.0 - 100.0 fL   MCH 27.3 26.0 - 34.0 pg   MCHC 30.5 30.0 - 36.0 g/dL   RDW 40.1 (H) 02.7 - 25.3 %   Platelets 69 (L) 150 - 400 K/uL  Basic metabolic panel     Status: Abnormal   Collection Time: 08/22/15 12:00 PM  Result Value Ref Range   Sodium 145 135 - 145 mmol/L   Potassium 5.7 (H) 3.5 - 5.1 mmol/L   Chloride 111 101 - 111 mmol/L   CO2 14 (L) 22 - 32 mmol/L   Glucose, Bld 161 (H) 65 - 99 mg/dL   BUN 17 6 - 20 mg/dL   Creatinine, Ser 6.64 0.61 - 1.24 mg/dL   Calcium 7.2 (L) 8.9 - 10.3 mg/dL   GFR calc non Af Amer >60 >60 mL/min   GFR calc Af Amer >60 >60 mL/min   Anion gap 20 (H) 5 - 15  Prepare RBC     Status: None   Collection Time: 08/22/15 12:19 PM  Result Value Ref Range   Order Confirmation ORDER PROCESSED BY BLOOD BANK   Prepare RBC     Status: None   Collection Time: 08/22/15 12:59 PM  Result Value Ref Range   Order Confirmation ORDER PROCESSED BY BLOOD BANK   MRSA PCR Screening     Status: None   Collection Time: 08/22/15  1:20 PM  Result Value Ref Range   MRSA by PCR NEGATIVE NEGATIVE  CBC     Status: Abnormal   Collection Time: 08/22/15  1:45 PM  Result Value Ref Range   WBC 9.3 4.0 - 10.5 K/uL   RBC 3.19 (L) 4.22 - 5.81 MIL/uL   Hemoglobin 8.7 (L) 13.0 - 17.0 g/dL   HCT 40.3 (L) 47.4 - 25.9 %   MCV 90.3 78.0 - 100.0 fL   MCH 27.3 26.0 - 34.0 pg   MCHC 30.2 30.0 - 36.0 g/dL   RDW 56.3 (H) 87.5 - 64.3 %   Platelets 79 (L) 150 - 400 K/uL  Lactic acid, plasma  Status: Abnormal   Collection Time: 08/22/15  1:45 PM  Result Value Ref Range   Lactic Acid, Venous 9.8 (HH) 0.5 - 1.9 mmol/L  I-STAT 3, arterial blood gas (G3+)     Status: Abnormal   Collection Time: 08/22/15  2:16 PM  Result Value Ref Range   pH, Arterial 7.096 (LL) 7.350 - 7.450   pCO2 arterial 30.2 (L) 35.0 - 45.0 mmHg   pO2, Arterial 249.0 (H) 80.0 - 100.0 mmHg   Bicarbonate 9.3 (L) 20.0 - 24.0 mEq/L   TCO2 10 0 - 100 mmol/L   O2 Saturation 100.0 %   Acid-base deficit 19.0 (H) 0.0 - 2.0 mmol/L   Patient temperature 98.6 F    Collection site RADIAL, ALLEN'S TEST ACCEPTABLE    Drawn by Operator    Sample type ARTERIAL    Comment NOTIFIED PHYSICIAN   Glucose, capillary     Status: Abnormal   Collection Time: 08/22/15  3:20 PM  Result Value Ref Range   Glucose-Capillary 189 (H) 65 - 99 mg/dL   Comment 1 Notify RN    Comment 2 Document in Chart   CBC     Status: Abnormal   Collection Time: 08/22/15  3:30 PM  Result Value Ref Range   WBC 14.2 (H) 4.0 - 10.5 K/uL   RBC 3.15 (L) 4.22 - 5.81 MIL/uL   Hemoglobin 8.7 (L) 13.0 - 17.0 g/dL   HCT 92.1 (L) 19.4 - 17.4 %   MCV 89.8 78.0 - 100.0 fL   MCH 27.6 26.0 - 34.0 pg   MCHC 30.7 30.0 - 36.0 g/dL   RDW 08.1 (H) 44.8 - 18.5  %   Platelets 80 (L) 150 - 400 K/uL  I-STAT 3, arterial blood gas (G3+)     Status: Abnormal   Collection Time: 08/22/15  6:28 PM  Result Value Ref Range   pH, Arterial 7.145 (LL) 7.350 - 7.450   pCO2 arterial 33.5 (L) 35.0 - 45.0 mmHg   pO2, Arterial 165.0 (H) 80.0 - 100.0 mmHg   Bicarbonate 11.5 (L) 20.0 - 24.0 mEq/L   TCO2 13 0 - 100 mmol/L   O2 Saturation 99.0 %   Acid-base deficit 16.0 (H) 0.0 - 2.0 mmol/L   Patient temperature 98.7 F    Collection site RADIAL, ALLEN'S TEST ACCEPTABLE    Drawn by Operator    Sample type ARTERIAL    Comment NOTIFIED PHYSICIAN   I-STAT 3, arterial blood gas (G3+)     Status: Abnormal   Collection Time: 08/22/15  7:43 PM  Result Value Ref Range   pH, Arterial 7.278 (L) 7.350 - 7.450   pCO2 arterial 27.2 (L) 35.0 - 45.0 mmHg   pO2, Arterial 177.0 (H) 80.0 - 100.0 mmHg   Bicarbonate 12.6 (L) 20.0 - 24.0 mEq/L   TCO2 13 0 - 100 mmol/L   O2 Saturation 99.0 %   Acid-base deficit 13.0 (H) 0.0 - 2.0 mmol/L   Patient temperature 99.6 F    Collection site RADIAL, ALLEN'S TEST ACCEPTABLE    Drawn by RT    Sample type ARTERIAL   Glucose, capillary     Status: Abnormal   Collection Time: 08/22/15  7:58 PM  Result Value Ref Range   Glucose-Capillary 202 (H) 65 - 99 mg/dL   Comment 1 Document in Chart   CBC     Status: Abnormal   Collection Time: 08/22/15 10:40 PM  Result Value Ref Range   WBC 13.1 (H) 4.0 - 10.5 K/uL  RBC 2.92 (L) 4.22 - 5.81 MIL/uL   Hemoglobin 7.9 (L) 13.0 - 17.0 g/dL   HCT 16.1 (L) 09.6 - 04.5 %   MCV 87.7 78.0 - 100.0 fL   MCH 27.1 26.0 - 34.0 pg   MCHC 30.9 30.0 - 36.0 g/dL   RDW 40.9 (H) 81.1 - 91.4 %   Platelets 74 (L) 150 - 400 K/uL  Glucose, capillary     Status: Abnormal   Collection Time: 08/23/15 12:16 AM  Result Value Ref Range   Glucose-Capillary 197 (H) 65 - 99 mg/dL   Comment 1 Document in Chart   CBC     Status: Abnormal   Collection Time: 08/23/15  2:59 AM  Result Value Ref Range   WBC 11.7 (H) 4.0 -  10.5 K/uL   RBC 2.69 (L) 4.22 - 5.81 MIL/uL   Hemoglobin 7.4 (L) 13.0 - 17.0 g/dL   HCT 78.2 (L) 95.6 - 21.3 %   MCV 87.4 78.0 - 100.0 fL   MCH 27.5 26.0 - 34.0 pg   MCHC 31.5 30.0 - 36.0 g/dL   RDW 08.6 (H) 57.8 - 46.9 %   Platelets 59 (L) 150 - 400 K/uL  Procalcitonin     Status: None   Collection Time: 08/23/15  2:59 AM  Result Value Ref Range   Procalcitonin 2.08 ng/mL  Lactic acid, plasma     Status: Abnormal   Collection Time: 08/23/15  2:59 AM  Result Value Ref Range   Lactic Acid, Venous 8.1 (HH) 0.5 - 1.9 mmol/L  Comprehensive metabolic panel     Status: Abnormal   Collection Time: 08/23/15  2:59 AM  Result Value Ref Range   Sodium 143 135 - 145 mmol/L   Potassium 4.6 3.5 - 5.1 mmol/L   Chloride 107 101 - 111 mmol/L   CO2 20 (L) 22 - 32 mmol/L   Glucose, Bld 201 (H) 65 - 99 mg/dL   BUN 25 (H) 6 - 20 mg/dL   Creatinine, Ser 6.29 (H) 0.61 - 1.24 mg/dL   Calcium 6.3 (LL) 8.9 - 10.3 mg/dL   Total Protein 4.7 (L) 6.5 - 8.1 g/dL   Albumin 2.5 (L) 3.5 - 5.0 g/dL   AST 5,284 (H) 15 - 41 U/L   ALT 531 (H) 17 - 63 U/L   Alkaline Phosphatase 64 38 - 126 U/L   Total Bilirubin 4.2 (H) 0.3 - 1.2 mg/dL   GFR calc non Af Amer 46 (L) >60 mL/min   GFR calc Af Amer 54 (L) >60 mL/min   Anion gap 16 (H) 5 - 15  Protime-INR     Status: Abnormal   Collection Time: 08/23/15  2:59 AM  Result Value Ref Range   Prothrombin Time 24.0 (H) 11.4 - 15.2 seconds   INR 2.11   I-STAT 3, arterial blood gas (G3+)     Status: Abnormal   Collection Time: 08/23/15  3:28 AM  Result Value Ref Range   pH, Arterial 7.470 (H) 7.350 - 7.450   pCO2 arterial 28.1 (L) 35.0 - 45.0 mmHg   pO2, Arterial 96.0 80.0 - 100.0 mmHg   Bicarbonate 20.3 20.0 - 24.0 mEq/L   TCO2 21 0 - 100 mmol/L   O2 Saturation 98.0 %   Acid-base deficit 3.0 (H) 0.0 - 2.0 mmol/L   Patient temperature 99.6 F    Collection site RADIAL, ALLEN'S TEST ACCEPTABLE    Drawn by RT    Sample type ARTERIAL   Glucose, capillary  Status:  Abnormal   Collection Time: 08/23/15  3:40 AM  Result Value Ref Range   Glucose-Capillary 205 (H) 65 - 99 mg/dL   Comment 1 Document in Chart   Glucose, capillary     Status: Abnormal   Collection Time: 08/23/15  7:31 AM  Result Value Ref Range   Glucose-Capillary 240 (H) 65 - 99 mg/dL   Comment 1 Notify RN   Urinalysis with microscopic (not at [facility])     Status: Abnormal   Collection Time: 08/23/15  8:25 AM  Result Value Ref Range   Color, Urine AMBER (A) YELLOW   APPearance CLEAR CLEAR   Specific Gravity, Urine 1.020 1.005 - 1.030   pH 5.5 5.0 - 8.0   Glucose, UA NEGATIVE NEGATIVE mg/dL   Hgb urine dipstick SMALL (A) NEGATIVE   Bilirubin Urine SMALL (A) NEGATIVE   Ketones, ur 15 (A) NEGATIVE mg/dL   Protein, ur NEGATIVE NEGATIVE mg/dL   Nitrite NEGATIVE NEGATIVE   Leukocytes, UA MODERATE (A) NEGATIVE   WBC, UA 6-30 0 - 5 WBC/hpf   RBC / HPF 0-5 0 - 5 RBC/hpf   Bacteria, UA NONE SEEN NONE SEEN   Squamous Epithelial / LPF 0-5 (A) NONE SEEN   Urine-Other LESS THAN 10 mL OF URINE SUBMITTED   Prepare fresh frozen plasma     Status: None (Preliminary result)   Collection Time: 08/23/15  9:12 AM  Result Value Ref Range   Unit Number [id number]    Blood Component Type THAWED PLASMA    Unit division 00    Status of Unit ALLOCATED    Transfusion Status OK TO TRANSFUSE    Unit Number [id number]    Blood Component Type THAWED PLASMA    Unit division 00    Status of Unit ISSUED    Transfusion Status OK TO TRANSFUSE   Lactic acid, plasma     Status: Abnormal   Collection Time: 08/23/15  9:40 AM  Result Value Ref Range   Lactic Acid, Venous 6.1 (HH) 0.5 - 1.9 mmol/L  CBC     Status: Abnormal   Collection Time: 08/23/15  9:50 AM  Result Value Ref Range   WBC 9.0 4.0 - 10.5 K/uL   RBC 2.55 (L) 4.22 - 5.81 MIL/uL   Hemoglobin 7.0 (L) 13.0 - 17.0 g/dL   HCT 30.8 (L) 65.7 - 84.6 %   MCV 87.5 78.0 - 100.0 fL   MCH 27.5 26.0 - 34.0 pg   MCHC 31.4 30.0 - 36.0 g/dL   RDW 96.2  (H) 95.2 - 15.5 %   Platelets 43 (L) 150 - 400 K/uL  Magnesium     Status: Abnormal   Collection Time: 08/23/15  9:50 AM  Result Value Ref Range   Magnesium 1.0 (L) 1.7 - 2.4 mg/dL    Studies/Results: Dg Chest Port 1 View  Result Date: 08/22/2015 CLINICAL DATA:  Status post intubation and central line placement today. EXAM: PORTABLE CHEST 1 VIEW COMPARISON:  Single-view of the chest earlier today. FINDINGS: Endotracheal tube is in place with the tip in good position 4 cm above the carina. Right IJ catheter tip projects in the lower superior vena cava. Negative for pneumothorax. Lungs are clear. Heart size is normal. No pleural effusion. IMPRESSION: ETT and right IJ catheter projecting good position. Negative for pneumothorax or acute disease. Electronically Signed   By: Drusilla Kanner M.D.   On: 08/22/2015 11:31  Dg Chest Port 1 View  Result Date: 08/22/2015 CLINICAL DATA:  Acute respiratory failure. EXAM: PORTABLE CHEST 1 VIEW COMPARISON:  None. FINDINGS: The heart size and mediastinal contours are within normal limits. Both lungs are clear. The visualized skeletal structures are unremarkable. IMPRESSION: No active disease. Electronically Signed   By: Ted Mcalpine M.D.   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 Marge Duncans recommendation.    Caidyn Blossom C 08/23/2015, 11:02 AM  Pager [phone] If no answer or after 5 PM call [phone]

## 2015-08-23 NOTE — Progress Notes (Addendum)
CRITICAL VALUE ALERT  Critical value received: LA 8.1  Date of notification:  08/23/2015  Time of notification:  0324  Critical value read back:Yes.    Nurse who received alert:  Vilinda Blanks  MD notified (1st page): Dr. Darrick Penna   Time of first page:    MD notified (2nd page):  Time of second page:  Responding MD: Dr. Darrick Penna    Time MD responded:  [phone]

## 2015-08-23 NOTE — Consult Note (Signed)
[facility]  Total days of antibiotics 2        Day 2 vanco        Day 2 ceftriaxone               Reason for Consult: polymicrobial bacteremia    Referring Physician: feinstein  Principal Problem:   GIB (gastrointestinal bleeding) Active Problems:   Alcoholism ([place])   Depression   Diabetes mellitus without complication ([place])   Hypercholesterolemia   Neuropathy (HCC)   Elevated lactic acid level   Upper GI bleeding   Hemorrhagic shock   Acute respiratory failure (HCC)    HPI: Bradley Harris is a 60 y.o. male with hx of alcohol abuse, hepatic steatotis, DM, depression and HLD who prsented on 7/28 with multiple episodes of hematemesis in addition to melena. He reports numerous episodes but denied any abdominal pain, lightheadedness or weakness. In the ED he was found to be tachycardic 110s plus hypotensive with sBP 80s. Hgb 8, with lactic acid of 8, and alcohol level of 316. Liver function test showed AST 240 and ALT 93 tbil 2.5. GI was consulted in addition to PCCm for evaluation of acute upper GI bleed. He was given blood transfusions as well empirically started on ceftriaxone for SBP proph. He underwent EGD which showed actively bleeding mallory weiss tears with large amount of blood in gastric pouch requiring cauterization and large EV, non bleeding, and pHTN. He was started on octreotide and proton pump inhibitor infusions. He remained intubated to protect his airway. His infectious work up revealed polymicrobial bacteremia including enterococcus.   Per hosp record, drink of choice drinks a bottle of bourbon daily. No known evaluation for viral hepatitis, no known varices or had surveillance EGD. Likely evidence of cirrhosis since he has low platelets. He has hx of having paracentesis, given dx of "fatty liver"  Overnight, his acidosis improved while being on bicarb drip which has now been discontinued. He received in total 4 units of RBC. He remains  intubated.FiO2 40% on vent, attempting to wean sedation.     Past Medical History:  Diagnosis Date  . Alcoholism ([place])   . Depression   . Diabetes mellitus without complication ([place])   . Hypercholesterolemia   . Neuropathy (HCC)   - s.p gastric bypass  Allergies: No Known Allergies  MEDICATIONS: . antiseptic oral rinse  7 mL Mouth Rinse QID  . cefTRIAXone (ROCEPHIN)  IV  2 g Intravenous Q24H  . chlorhexidine gluconate (SAGE KIT)  15 mL Mouth Rinse BID  . folic acid  1 mg Intravenous Daily  . insulin aspart  0-15 Units Subcutaneous Q4H  . [START ON 08/25/2015] pantoprazole  40 mg Intravenous Q12H  . sodium chloride flush  3 mL Intravenous Q12H  . thiamine  100 mg Oral Daily   Or  . thiamine  100 mg Intravenous Daily  . vancomycin  1,000 mg Intravenous Q8H    Social History  Substance Use Topics  . Smoking status: Never Smoker  . Smokeless tobacco: Never Used  . Alcohol use Yes    Family History  Problem Relation Age of Onset  . Diabetes Mellitus I Mother   . Dementia Father     Review of Systems -  Unable to obtain since patient is sedated and intubated  OBJECTIVE: Temp:  [98.6 F (37 C)-100.2 F (37.9 C)] 100.1 F (37.8 C) (07/29 1044) Pulse Rate:  [80-132] 122 (07/29 1044) Resp:  [10-25] 17 (07/29 1044)  BP: (81-151)/(51-86) 112/59 (07/29 1044) SpO2:  [93 %-100 %] 95 % (07/29 1044) FiO2 (%):  [40 %-50 %] 40 % (07/29 0905) Weight:  [110.9 kg (244 lb 6.4 oz)] 110.9 kg (244 lb 6.4 oz) (07/29 0500) Physical Exam  Constitutional: He opens eyes to verbal stimuli. He appears well-developed and well-nourished. No distress. diaphoretic HENT: scleral icterus Mouth/Throat: Oropharynx is clear and moist. No oropharyngeal exudate.  Cardiovascular: tachy, regular rhythm and normal heart sounds. Exam reveals no gallop and no friction rub.  No murmur heard.  Pulmonary/Chest: Effort normal and breath sounds normal. No respiratory distress. He has no wheezes.  Abdominal:  soft but mildly distended. Bowel sounds are decreased. He exhibits no distension. There is no tenderness.  Skin: Skin is warm and dry. No rash noted. No erythema. Mild jaundice. Scattered echymosis, petechaie  Psychiatric: He is sedated    LABS: Results for orders placed or performed during the hospital encounter of 08/22/15 (from the past 48 hour(s))  Sample to Blood Bank     Status: None   Collection Time: 08/22/15  1:40 AM  Result Value Ref Range   Blood Bank Specimen SAMPLE AVAILABLE FOR TESTING    Sample Expiration 08/23/2015   Type and screen Bradley Harris     Status: None   Collection Time: 08/22/15  1:40 AM  Result Value Ref Range   ABO/RH(D) O POS    Antibody Screen NEG    Sample Expiration 08/25/2015    Unit Number [id number]    Blood Component Type RBC LR PHER1    Unit division 00    Status of Unit REL FROM [facility]    Transfusion Status OK TO TRANSFUSE    Crossmatch Result Compatible    Unit Number [id number]    Blood Component Type RED CELLS,LR    Unit division 00    Status of Unit ISSUED,FINAL    Transfusion Status OK TO TRANSFUSE    Crossmatch Result Compatible    Unit Number [id number]    Blood Component Type RED CELLS,LR    Unit division 00    Status of Unit REL FROM [facility]    Transfusion Status OK TO TRANSFUSE    Crossmatch Result Compatible   ABO/Rh     Status: None   Collection Time: 08/22/15  1:40 AM  Result Value Ref Range   ABO/RH(D) O POS   CBC with Differential     Status: Abnormal   Collection Time: 08/22/15  1:47 AM  Result Value Ref Range   WBC 4.3 4.0 - 10.5 K/uL   RBC 2.97 (L) 4.22 - 5.81 MIL/uL   Hemoglobin 8.1 (L) 13.0 - 17.0 g/dL   HCT 25.7 (L) 39.0 - 52.0 %   MCV 86.5 78.0 - 100.0 fL   MCH 27.3 26.0 - 34.0 pg   MCHC 31.5 30.0 - 36.0 g/dL   RDW 17.5 (H) 11.5 - 15.5 %   Platelets 73 (L) 150 - 400 K/uL    Comment: PLATELET COUNT CONFIRMED BY SMEAR   Neutrophils Relative % 64 %   Lymphocytes Relative 28 %    Monocytes Relative 7 %   Eosinophils Relative 0 %   Basophils Relative 1 %   Neutro Abs 2.8 1.7 - 7.7 K/uL   Lymphs Abs 1.2 0.7 - 4.0 K/uL   Monocytes Absolute 0.3 0.1 - 1.0 K/uL   Eosinophils Absolute 0.0 0.0 - 0.7 K/uL   Basophils Absolute 0.0 0.0 - 0.1 K/uL   RBC Morphology ELLIPTOCYTES  WBC Morphology MILD LEFT SHIFT (1-5% METAS, OCC MYELO, OCC BANDS)   Comprehensive metabolic panel     Status: Abnormal   Collection Time: 08/22/15  1:47 AM  Result Value Ref Range   Sodium 142 135 - 145 mmol/L   Potassium 4.5 3.5 - 5.1 mmol/L   Chloride 101 101 - 111 mmol/L   CO2 19 (L) 22 - 32 mmol/L   Glucose, Bld 166 (H) 65 - 99 mg/dL   BUN 15 6 - 20 mg/dL   Creatinine, Ser 1.13 0.61 - 1.24 mg/dL   Calcium 8.3 (L) 8.9 - 10.3 mg/dL   Total Protein 6.5 6.5 - 8.1 g/dL   Albumin 3.5 3.5 - 5.0 g/dL   AST 240 (H) 15 - 41 U/L   ALT 93 (H) 17 - 63 U/L   Alkaline Phosphatase 107 38 - 126 U/L   Total Bilirubin 2.5 (H) 0.3 - 1.2 mg/dL   GFR calc non Af Amer >60 >60 mL/min   GFR calc Af Amer >60 >60 mL/min    Comment: (NOTE) The eGFR has been calculated using the CKD EPI equation. This calculation has not been validated in all clinical situations. eGFR's persistently <60 mL/min signify possible Chronic Kidney Disease.    Anion gap 22 (H) 5 - 15    Comment: RESULT CHECKED  Ethanol     Status: Abnormal   Collection Time: 08/22/15  1:47 AM  Result Value Ref Range   Alcohol, Ethyl (B) 316 (HH) <5 mg/dL    Comment:        LOWEST DETECTABLE LIMIT FOR SERUM ALCOHOL IS 5 mg/dL FOR MEDICAL PURPOSES ONLY CRITICAL RESULT CALLED TO, READ BACK BY AND VERIFIED WITH: STRAUGHN,K RN 08/22/2015 0303 JORDANS   Protime-INR     Status: Abnormal   Collection Time: 08/22/15  1:47 AM  Result Value Ref Range   Prothrombin Time 16.9 (H) 11.4 - 15.2 seconds   INR 1.36   Pathologist smear review     Status: None   Collection Time: 08/22/15  1:47 AM  Result Value Ref Range   Path Review Normocytic  anemia.Thrombocytopenia     Comment: Reviewed by Kalman Drape, M.D. [phone]   I-Stat CG4 Lactic Acid, ED     Status: Abnormal   Collection Time: 08/22/15  2:31 AM  Result Value Ref Range   Lactic Acid, Venous 8.00 (HH) 0.5 - 1.9 mmol/L   Comment NOTIFIED PHYSICIAN   Prepare RBC     Status: None   Collection Time: 08/22/15  4:05 AM  Result Value Ref Range   Order Confirmation ORDER PROCESSED BY BLOOD BANK   Hemoglobin A1c     Status: Abnormal   Collection Time: 08/22/15  4:50 AM  Result Value Ref Range   Hgb A1c MFr Bld 7.2 (H) 4.8 - 5.6 %    Comment: (NOTE)         Pre-diabetes: 5.7 - 6.4         Diabetes: >6.4         Glycemic control for adults with diabetes: <7.0    Mean Plasma Glucose 160 mg/dL    Comment: (NOTE) Performed At: [facility] [place], [place] [id number] Lindon Romp MD FU:[id number]   CBC     Status: Abnormal   Collection Time: 08/22/15  6:08 AM  Result Value Ref Range   WBC 5.1 4.0 - 10.5 K/uL   RBC 2.29 (L) 4.22 - 5.81 MIL/uL   Hemoglobin 6.3 (LL) 13.0 -  17.0 g/dL    Comment: REPEATED TO VERIFY CRITICAL RESULT CALLED TO, READ BACK BY AND VERIFIED WITH: HILL,J RN @ 6378 08/22/15 LEONARD,A    HCT 20.0 (L) 39.0 - 52.0 %   MCV 87.3 78.0 - 100.0 fL   MCH 27.5 26.0 - 34.0 pg   MCHC 31.5 30.0 - 36.0 g/dL   RDW 17.7 (H) 11.5 - 15.5 %   Platelets 73 (L) 150 - 400 K/uL    Comment: CONSISTENT WITH PREVIOUS RESULT  Comprehensive metabolic panel     Status: Abnormal   Collection Time: 08/22/15  6:08 AM  Result Value Ref Range   Sodium 143 135 - 145 mmol/L   Potassium 4.6 3.5 - 5.1 mmol/L   Chloride 102 101 - 111 mmol/L   CO2 15 (L) 22 - 32 mmol/L   Glucose, Bld 149 (H) 65 - 99 mg/dL   BUN 15 6 - 20 mg/dL   Creatinine, Ser 1.03 0.61 - 1.24 mg/dL   Calcium 8.1 (L) 8.9 - 10.3 mg/dL   Total Protein 6.3 (L) 6.5 - 8.1 g/dL   Albumin 3.4 (L) 3.5 - 5.0 g/dL   AST 304 (H) 15 - 41 U/L   ALT 114 (H) 17 - 63 U/L   Alkaline Phosphatase  99 38 - 126 U/L   Total Bilirubin 2.4 (H) 0.3 - 1.2 mg/dL   GFR calc non Af Amer >60 >60 mL/min   GFR calc Af Amer >60 >60 mL/min    Comment: (NOTE) The eGFR has been calculated using the CKD EPI equation. This calculation has not been validated in all clinical situations. eGFR's persistently <60 mL/min signify possible Chronic Kidney Disease.    Anion gap 26 (H) 5 - 15  Lactic acid, plasma     Status: Abnormal   Collection Time: 08/22/15  6:08 AM  Result Value Ref Range   Lactic Acid, Venous 8.8 (HH) 0.5 - 1.9 mmol/L    Comment: CRITICAL RESULT CALLED TO, READ BACK BY AND VERIFIED WITH: JASMINE HILL,RN AT 0711 08/22/15 BY ZBEECH.   Transfusion reaction     Status: None   Collection Time: 08/22/15  6:19 AM  Result Value Ref Range   Post RXN DAT IgG NEG    DAT C3 NEG    Path interp tx rxn      No laboratory evidence of hemolytic transfusion reaction. Reviewed by Dr. Claudette Laws 08/22/15.  Type and screen [place]     Status: None (Preliminary result)   Collection Time: 08/22/15  6:43 AM  Result Value Ref Range   ABO/RH(D) O POS    Antibody Screen NEG    Sample Expiration 08/25/2015    Unit Number [id number]    Blood Component Type RBC LR PHER1    Unit division 00    Status of Unit ISSUED,FINAL    Transfusion Status OK TO TRANSFUSE    Crossmatch Result Compatible    Unit Number [id number]    Blood Component Type RED CELLS,LR    Unit division 00    Status of Unit ISSUED,FINAL    Transfusion Status OK TO TRANSFUSE    Crossmatch Result Compatible    Unit Number [id number]    Blood Component Type RED CELLS,LR    Unit division 00    Status of Unit ISSUED,FINAL    Transfusion Status OK TO TRANSFUSE    Crossmatch Result Compatible    Unit Number [id number]    Blood Component Type RED CELLS,LR  Unit division 00    Status of Unit ISSUED,FINAL    Transfusion Status OK TO TRANSFUSE    Crossmatch Result Compatible    Unit Number [id number]     Blood Component Type RED CELLS,LR    Unit division 00    Status of Unit ALLOCATED    Transfusion Status OK TO TRANSFUSE    Crossmatch Result Compatible    Unit Number [id number]    Blood Component Type RED CELLS,LR    Unit division 00    Status of Unit ALLOCATED    Transfusion Status OK TO TRANSFUSE    Crossmatch Result Compatible   Prepare RBC     Status: None   Collection Time: 08/22/15  6:43 AM  Result Value Ref Range   Order Confirmation ORDER PROCESSED BY BLOOD BANK   Comprehensive metabolic panel     Status: Abnormal   Collection Time: 08/22/15  7:42 AM  Result Value Ref Range   Sodium 144 135 - 145 mmol/L   Potassium 5.6 (H) 3.5 - 5.1 mmol/L   Chloride 110 101 - 111 mmol/L   CO2 12 (L) 22 - 32 mmol/L   Glucose, Bld 163 (H) 65 - 99 mg/dL   BUN 17 6 - 20 mg/dL   Creatinine, Ser 1.02 0.61 - 1.24 mg/dL   Calcium 7.2 (L) 8.9 - 10.3 mg/dL   Total Protein 5.6 (L) 6.5 - 8.1 g/dL   Albumin 2.9 (L) 3.5 - 5.0 g/dL   AST 763 (H) 15 - 41 U/L   ALT 282 (H) 17 - 63 U/L   Alkaline Phosphatase 84 38 - 126 U/L   Total Bilirubin 2.6 (H) 0.3 - 1.2 mg/dL   GFR calc non Af Amer >60 >60 mL/min   GFR calc Af Amer >60 >60 mL/min    Comment: (NOTE) The eGFR has been calculated using the CKD EPI equation. This calculation has not been validated in all clinical situations. eGFR's persistently <60 mL/min signify possible Chronic Kidney Disease.    Anion gap 22 (H) 5 - 15  Magnesium     Status: Abnormal   Collection Time: 08/22/15  7:42 AM  Result Value Ref Range   Magnesium 1.5 (L) 1.7 - 2.4 mg/dL  Phosphorus     Status: Abnormal   Collection Time: 08/22/15  7:42 AM  Result Value Ref Range   Phosphorus 5.4 (H) 2.5 - 4.6 mg/dL  Amylase     Status: None   Collection Time: 08/22/15  7:42 AM  Result Value Ref Range   Amylase 38 28 - 100 U/L  Lipase, blood     Status: None   Collection Time: 08/22/15  7:42 AM  Result Value Ref Range   Lipase 42 11 - 51 U/L  Troponin I     Status:  None   Collection Time: 08/22/15  7:42 AM  Result Value Ref Range   Troponin I <0.03 <0.03 ng/mL  Procalcitonin     Status: None   Collection Time: 08/22/15  7:42 AM  Result Value Ref Range   Procalcitonin 0.18 ng/mL    Comment:        Interpretation: PCT (Procalcitonin) <= 0.5 ng/mL: Systemic infection (sepsis) is not likely. Local bacterial infection is possible. (NOTE)         ICU PCT Algorithm               Non ICU PCT Algorithm    ----------------------------     ------------------------------  PCT < 0.25 ng/mL                 PCT < 0.1 ng/mL     Stopping of antibiotics            Stopping of antibiotics       strongly encouraged.               strongly encouraged.    ----------------------------     ------------------------------       PCT level decrease by               PCT < 0.25 ng/mL       >= 80% from peak PCT       OR PCT 0.25 - 0.5 ng/mL          Stopping of antibiotics                                             encouraged.     Stopping of antibiotics           encouraged.    ----------------------------     ------------------------------       PCT level decrease by              PCT >= 0.25 ng/mL       < 80% from peak PCT        AND PCT >= 0.5 ng/mL            Continuin g antibiotics                                              encouraged.       Continuing antibiotics            encouraged.    ----------------------------     ------------------------------     PCT level increase compared          PCT > 0.5 ng/mL         with peak PCT AND          PCT >= 0.5 ng/mL             Escalation of antibiotics                                          strongly encouraged.      Escalation of antibiotics        strongly encouraged.   Brain natriuretic peptide     Status: None   Collection Time: 08/22/15  7:42 AM  Result Value Ref Range   B Natriuretic Peptide 14.7 0.0 - 100.0 pg/mL  Cortisol     Status: None   Collection Time: 08/22/15  7:42 AM  Result Value Ref  Range   Cortisol, Plasma 37.2 ug/dL    Comment: (NOTE) AM    6.7 - 22.6 ug/dL PM   <10.0       ug/dL   CBC WITH DIFFERENTIAL     Status: Abnormal   Collection Time: 08/22/15  7:42 AM  Result Value Ref Range   WBC 4.5 4.0 - 10.5 K/uL   RBC 2.17 (L) 4.22 - 5.81 MIL/uL  Hemoglobin 5.8 (LL) 13.0 - 17.0 g/dL    Comment: REPEATED TO VERIFY CRITICAL VALUE NOTED.  VALUE IS CONSISTENT WITH PREVIOUSLY REPORTED AND CALLED VALUE.    HCT 19.3 (L) 39.0 - 52.0 %   MCV 88.9 78.0 - 100.0 fL   MCH 26.7 26.0 - 34.0 pg   MCHC 30.1 30.0 - 36.0 g/dL   RDW 17.7 (H) 11.5 - 15.5 %   Platelets 61 (L) 150 - 400 K/uL    Comment: CONSISTENT WITH PREVIOUS RESULT REPEATED TO VERIFY    Neutrophils Relative % 79 %   Lymphocytes Relative 11 %   Monocytes Relative 10 %   Eosinophils Relative 0 %   Basophils Relative 0 %   Neutro Abs 3.5 1.7 - 7.7 K/uL   Lymphs Abs 0.5 (L) 0.7 - 4.0 K/uL   Monocytes Absolute 0.5 0.1 - 1.0 K/uL   Eosinophils Absolute 0.0 0.0 - 0.7 K/uL   Basophils Absolute 0.0 0.0 - 0.1 K/uL   RBC Morphology TEARDROP CELLS     Comment: ELLIPTOCYTES  Protime-INR     Status: Abnormal   Collection Time: 08/22/15  7:42 AM  Result Value Ref Range   Prothrombin Time 21.1 (H) 11.4 - 15.2 seconds   INR 1.80   APTT     Status: None   Collection Time: 08/22/15  7:42 AM  Result Value Ref Range   aPTT 28 24 - 36 seconds  D-dimer, quantitative (not at [facility])     Status: Abnormal   Collection Time: 08/22/15  7:42 AM  Result Value Ref Range   D-Dimer, Quant 3.29 (H) 0.00 - 0.50 ug/mL-FEU    Comment: (NOTE) At the manufacturer cut-off of 0.50 ug/mL FEU, this assay has been documented to exclude PE with a sensitivity and negative predictive value of 97 to 99%.  At this time, this assay has not been approved by the FDA to exclude DVT/VTE. Results should be correlated with clinical presentation.   Lactic acid, plasma     Status: Abnormal   Collection Time: 08/22/15  7:42 AM  Result Value Ref Range    Lactic Acid, Venous 9.6 (HH) 0.5 - 1.9 mmol/L    Comment: CRITICAL RESULT CALLED TO, READ BACK BY AND VERIFIED WITH: L.FLOOD,RN 0946 08/22/15 CLARK,S   Culture, blood (routine x 2)     Status: None (Preliminary result)   Collection Time: 08/22/15  7:45 AM  Result Value Ref Range   Specimen Description BLOOD LEFT ANTECUBITAL    Special Requests BOTTLES DRAWN AEROBIC AND ANAEROBIC 5CC    Culture  Setup Time      GRAM NEGATIVE RODS GRAM POSITIVE COCCI IN PAIRS AND CHAINS IN BOTH AEROBIC AND ANAEROBIC BOTTLES Organism ID to follow CRITICAL RESULT CALLED TO, READ BACK BY AND VERIFIED WITH: V BRYK PHARMD 2254 08/22/15 A BROWNING    Culture GRAM NEGATIVE RODS GRAM POSITIVE COCCI     Report Status PENDING   Blood Culture ID Panel (Reflexed)     Status: Abnormal   Collection Time: 08/22/15  7:45 AM  Result Value Ref Range   Enterococcus species DETECTED (A) NOT DETECTED    Comment: CRITICAL RESULT CALLED TO, READ BACK BY AND VERIFIED WITH: V BRYK PHARMD 2254 08/22/15 A BROWNING    Vancomycin resistance NOT DETECTED NOT DETECTED   Listeria monocytogenes NOT DETECTED NOT DETECTED   Staphylococcus species NOT DETECTED NOT DETECTED   Staphylococcus aureus NOT DETECTED NOT DETECTED   Methicillin resistance NOT DETECTED NOT DETECTED   Streptococcus  species NOT DETECTED NOT DETECTED   Streptococcus agalactiae NOT DETECTED NOT DETECTED   Streptococcus pneumoniae NOT DETECTED NOT DETECTED   Streptococcus pyogenes NOT DETECTED NOT DETECTED   Acinetobacter baumannii NOT DETECTED NOT DETECTED   Enterobacteriaceae species DETECTED (A) NOT DETECTED    Comment: CRITICAL RESULT CALLED TO, READ BACK BY AND VERIFIED WITH: V BRYK PHARMD 2254 08/22/15 A BROWNING    Enterobacter cloacae complex NOT DETECTED NOT DETECTED   Escherichia coli DETECTED (A) NOT DETECTED    Comment: CRITICAL RESULT CALLED TO, READ BACK BY AND VERIFIED WITH: V BRYK PHARMD 2254 08/22/15 A BROWNING    Klebsiella oxytoca NOT  DETECTED NOT DETECTED   Klebsiella pneumoniae DETECTED (A) NOT DETECTED    Comment: CRITICAL RESULT CALLED TO, READ BACK BY AND VERIFIED WITH: V BRYK PHARMD 2254 08/22/15 A BROWNING    Proteus species NOT DETECTED NOT DETECTED   Serratia marcescens NOT DETECTED NOT DETECTED   Carbapenem resistance NOT DETECTED NOT DETECTED   Haemophilus influenzae NOT DETECTED NOT DETECTED   Neisseria meningitidis NOT DETECTED NOT DETECTED   Pseudomonas aeruginosa NOT DETECTED NOT DETECTED   Candida albicans NOT DETECTED NOT DETECTED   Candida glabrata NOT DETECTED NOT DETECTED   Candida krusei NOT DETECTED NOT DETECTED   Candida parapsilosis NOT DETECTED NOT DETECTED   Candida tropicalis NOT DETECTED NOT DETECTED  Prepare RBC     Status: None   Collection Time: 08/22/15  7:46 AM  Result Value Ref Range   Order Confirmation ORDER PROCESSED BY BLOOD BANK   Glucose, capillary     Status: Abnormal   Collection Time: 08/22/15  9:06 AM  Result Value Ref Range   Glucose-Capillary 142 (H) 65 - 99 mg/dL   Comment 1 Notify RN    Comment 2 Document in Chart   Glucose, capillary     Status: Abnormal   Collection Time: 08/22/15 11:11 AM  Result Value Ref Range   Glucose-Capillary 151 (H) 65 - 99 mg/dL   Comment 1 Notify RN    Comment 2 Document in Chart   CBC     Status: Abnormal   Collection Time: 08/22/15 11:55 AM  Result Value Ref Range   WBC 7.3 4.0 - 10.5 K/uL   RBC 2.60 (L) 4.22 - 5.81 MIL/uL   Hemoglobin 7.1 (L) 13.0 - 17.0 g/dL   HCT 23.3 (L) 39.0 - 52.0 %   MCV 89.6 78.0 - 100.0 fL   MCH 27.3 26.0 - 34.0 pg   MCHC 30.5 30.0 - 36.0 g/dL   RDW 16.8 (H) 11.5 - 15.5 %   Platelets 69 (L) 150 - 400 K/uL    Comment: CONSISTENT WITH PREVIOUS RESULT  Basic metabolic panel     Status: Abnormal   Collection Time: 08/22/15 12:00 PM  Result Value Ref Range   Sodium 145 135 - 145 mmol/L   Potassium 5.7 (H) 3.5 - 5.1 mmol/L   Chloride 111 101 - 111 mmol/L   CO2 14 (L) 22 - 32 mmol/L   Glucose, Bld  161 (H) 65 - 99 mg/dL   BUN 17 6 - 20 mg/dL   Creatinine, Ser 0.99 0.61 - 1.24 mg/dL   Calcium 7.2 (L) 8.9 - 10.3 mg/dL   GFR calc non Af Amer >60 >60 mL/min   GFR calc Af Amer >60 >60 mL/min    Comment: (NOTE) The eGFR has been calculated using the CKD EPI equation. This calculation has not been validated in all clinical situations. eGFR's persistently <  60 mL/min signify possible Chronic Kidney Disease.    Anion gap 20 (H) 5 - 15  Prepare RBC     Status: None   Collection Time: 08/22/15 12:19 PM  Result Value Ref Range   Order Confirmation ORDER PROCESSED BY BLOOD BANK   Prepare RBC     Status: None   Collection Time: 08/22/15 12:59 PM  Result Value Ref Range   Order Confirmation ORDER PROCESSED BY BLOOD BANK   MRSA PCR Screening     Status: None   Collection Time: 08/22/15  1:20 PM  Result Value Ref Range   MRSA by PCR NEGATIVE NEGATIVE    Comment:        The GeneXpert MRSA Assay (FDA approved for NASAL specimens only), is one component of a comprehensive MRSA colonization surveillance program. It is not intended to diagnose MRSA infection nor to guide or monitor treatment for MRSA infections.   CBC     Status: Abnormal   Collection Time: 08/22/15  1:45 PM  Result Value Ref Range   WBC 9.3 4.0 - 10.5 K/uL   RBC 3.19 (L) 4.22 - 5.81 MIL/uL   Hemoglobin 8.7 (L) 13.0 - 17.0 g/dL   HCT 28.8 (L) 39.0 - 52.0 %   MCV 90.3 78.0 - 100.0 fL   MCH 27.3 26.0 - 34.0 pg   MCHC 30.2 30.0 - 36.0 g/dL   RDW 15.6 (H) 11.5 - 15.5 %   Platelets 79 (L) 150 - 400 K/uL    Comment: CONSISTENT WITH PREVIOUS RESULT  Lactic acid, plasma     Status: Abnormal   Collection Time: 08/22/15  1:45 PM  Result Value Ref Range   Lactic Acid, Venous 9.8 (HH) 0.5 - 1.9 mmol/L    Comment: CRITICAL RESULT CALLED TO, READ BACK BY AND VERIFIED WITH: LINDSAY FLOOD,RN AT 1440 08/22/15 BY ZBEECH.   I-STAT 3, arterial blood gas (G3+)     Status: Abnormal   Collection Time: 08/22/15  2:16 PM  Result Value  Ref Range   pH, Arterial 7.096 (LL) 7.350 - 7.450   pCO2 arterial 30.2 (L) 35.0 - 45.0 mmHg   pO2, Arterial 249.0 (H) 80.0 - 100.0 mmHg   Bicarbonate 9.3 (L) 20.0 - 24.0 mEq/L   TCO2 10 0 - 100 mmol/L   O2 Saturation 100.0 %   Acid-base deficit 19.0 (H) 0.0 - 2.0 mmol/L   Patient temperature 98.6 F    Collection site RADIAL, ALLEN'S TEST ACCEPTABLE    Drawn by Operator    Sample type ARTERIAL    Comment NOTIFIED PHYSICIAN   Glucose, capillary     Status: Abnormal   Collection Time: 08/22/15  3:20 PM  Result Value Ref Range   Glucose-Capillary 189 (H) 65 - 99 mg/dL   Comment 1 Notify RN    Comment 2 Document in Chart   CBC     Status: Abnormal   Collection Time: 08/22/15  3:30 PM  Result Value Ref Range   WBC 14.2 (H) 4.0 - 10.5 K/uL   RBC 3.15 (L) 4.22 - 5.81 MIL/uL   Hemoglobin 8.7 (L) 13.0 - 17.0 g/dL   HCT 28.3 (L) 39.0 - 52.0 %   MCV 89.8 78.0 - 100.0 fL   MCH 27.6 26.0 - 34.0 pg   MCHC 30.7 30.0 - 36.0 g/dL   RDW 16.1 (H) 11.5 - 15.5 %   Platelets 80 (L) 150 - 400 K/uL    Comment: REPEATED TO VERIFY SPECIMEN CHECKED FOR CLOTS CONSISTENT  WITH PREVIOUS RESULT   I-STAT 3, arterial blood gas (G3+)     Status: Abnormal   Collection Time: 08/22/15  6:28 PM  Result Value Ref Range   pH, Arterial 7.145 (LL) 7.350 - 7.450   pCO2 arterial 33.5 (L) 35.0 - 45.0 mmHg   pO2, Arterial 165.0 (H) 80.0 - 100.0 mmHg   Bicarbonate 11.5 (L) 20.0 - 24.0 mEq/L   TCO2 13 0 - 100 mmol/L   O2 Saturation 99.0 %   Acid-base deficit 16.0 (H) 0.0 - 2.0 mmol/L   Patient temperature 98.7 F    Collection site RADIAL, ALLEN'S TEST ACCEPTABLE    Drawn by Operator    Sample type ARTERIAL    Comment NOTIFIED PHYSICIAN   I-STAT 3, arterial blood gas (G3+)     Status: Abnormal   Collection Time: 08/22/15  7:43 PM  Result Value Ref Range   pH, Arterial 7.278 (L) 7.350 - 7.450   pCO2 arterial 27.2 (L) 35.0 - 45.0 mmHg   pO2, Arterial 177.0 (H) 80.0 - 100.0 mmHg   Bicarbonate 12.6 (L) 20.0 - 24.0  mEq/L   TCO2 13 0 - 100 mmol/L   O2 Saturation 99.0 %   Acid-base deficit 13.0 (H) 0.0 - 2.0 mmol/L   Patient temperature 99.6 F    Collection site RADIAL, ALLEN'S TEST ACCEPTABLE    Drawn by RT    Sample type ARTERIAL   Glucose, capillary     Status: Abnormal   Collection Time: 08/22/15  7:58 PM  Result Value Ref Range   Glucose-Capillary 202 (H) 65 - 99 mg/dL   Comment 1 Document in Chart   CBC     Status: Abnormal   Collection Time: 08/22/15 10:40 PM  Result Value Ref Range   WBC 13.1 (H) 4.0 - 10.5 K/uL   RBC 2.92 (L) 4.22 - 5.81 MIL/uL   Hemoglobin 7.9 (L) 13.0 - 17.0 g/dL   HCT 25.6 (L) 39.0 - 52.0 %   MCV 87.7 78.0 - 100.0 fL   MCH 27.1 26.0 - 34.0 pg   MCHC 30.9 30.0 - 36.0 g/dL   RDW 16.5 (H) 11.5 - 15.5 %   Platelets 74 (L) 150 - 400 K/uL    Comment: SPECIMEN CHECKED FOR CLOTS REPEATED TO VERIFY CONSISTENT WITH PREVIOUS RESULT   Glucose, capillary     Status: Abnormal   Collection Time: 08/23/15 12:16 AM  Result Value Ref Range   Glucose-Capillary 197 (H) 65 - 99 mg/dL   Comment 1 Document in Chart   CBC     Status: Abnormal   Collection Time: 08/23/15  2:59 AM  Result Value Ref Range   WBC 11.7 (H) 4.0 - 10.5 K/uL   RBC 2.69 (L) 4.22 - 5.81 MIL/uL   Hemoglobin 7.4 (L) 13.0 - 17.0 g/dL   HCT 23.5 (L) 39.0 - 52.0 %   MCV 87.4 78.0 - 100.0 fL   MCH 27.5 26.0 - 34.0 pg   MCHC 31.5 30.0 - 36.0 g/dL   RDW 16.6 (H) 11.5 - 15.5 %   Platelets 59 (L) 150 - 400 K/uL  Procalcitonin     Status: None   Collection Time: 08/23/15  2:59 AM  Result Value Ref Range   Procalcitonin 2.08 ng/mL    Comment:        Interpretation: PCT > 2 ng/mL: Systemic infection (sepsis) is likely, unless other causes are known. (NOTE)         ICU PCT Algorithm  Non ICU PCT Algorithm    ----------------------------     ------------------------------         PCT < 0.25 ng/mL                 PCT < 0.1 ng/mL     Stopping of antibiotics            Stopping of antibiotics        strongly encouraged.               strongly encouraged.    ----------------------------     ------------------------------       PCT level decrease by               PCT < 0.25 ng/mL       >= 80% from peak PCT       OR PCT 0.25 - 0.5 ng/mL          Stopping of antibiotics                                             encouraged.     Stopping of antibiotics           encouraged.    ----------------------------     ------------------------------       PCT level decrease by              PCT >= 0.25 ng/mL       < 80% from peak PCT        AND PCT >= 0.5 ng/mL            Continuing antibiotics                                               encouraged.       Continuing antibiotics            encouraged.    ----------------------------     ------------------------------     PCT level increase compared          PCT > 0.5 ng/mL         with peak PCT AND          PCT >= 0.5 ng/mL             Escalation of antibiotics                                          strongly encouraged.      Escalation of antibiotics        strongly encouraged.   Lactic acid, plasma     Status: Abnormal   Collection Time: 08/23/15  2:59 AM  Result Value Ref Range   Lactic Acid, Venous 8.1 (HH) 0.5 - 1.9 mmol/L    Comment: CRITICAL RESULT CALLED TO, READ BACK BY AND VERIFIED WITH: WOODSON,B RN 08/23/2015 0324 JORDANS   Comprehensive metabolic panel     Status: Abnormal   Collection Time: 08/23/15  2:59 AM  Result Value Ref Range   Sodium 143 135 - 145 mmol/L   Potassium 4.6 3.5 - 5.1 mmol/L    Comment: DELTA CHECK NOTED   Chloride 107  101 - 111 mmol/L   CO2 20 (L) 22 - 32 mmol/L   Glucose, Bld 201 (H) 65 - 99 mg/dL   BUN 25 (H) 6 - 20 mg/dL   Creatinine, Ser 1.60 (H) 0.61 - 1.24 mg/dL   Calcium 6.3 (LL) 8.9 - 10.3 mg/dL    Comment: CRITICAL RESULT CALLED TO, READ BACK BY AND VERIFIED WITH: WOODSON,B RN 0409 7.29.17 MCADOO,G    Total Protein 4.7 (L) 6.5 - 8.1 g/dL   Albumin 2.5 (L) 3.5 - 5.0 g/dL   AST 1,884  (H) 15 - 41 U/L   ALT 531 (H) 17 - 63 U/L   Alkaline Phosphatase 64 38 - 126 U/L   Total Bilirubin 4.2 (H) 0.3 - 1.2 mg/dL   GFR calc non Af Amer 46 (L) >60 mL/min   GFR calc Af Amer 54 (L) >60 mL/min    Comment: (NOTE) The eGFR has been calculated using the CKD EPI equation. This calculation has not been validated in all clinical situations. eGFR's persistently <60 mL/min signify possible Chronic Kidney Disease.    Anion gap 16 (H) 5 - 15  Protime-INR     Status: Abnormal   Collection Time: 08/23/15  2:59 AM  Result Value Ref Range   Prothrombin Time 24.0 (H) 11.4 - 15.2 seconds   INR 2.11   I-STAT 3, arterial blood gas (G3+)     Status: Abnormal   Collection Time: 08/23/15  3:28 AM  Result Value Ref Range   pH, Arterial 7.470 (H) 7.350 - 7.450   pCO2 arterial 28.1 (L) 35.0 - 45.0 mmHg   pO2, Arterial 96.0 80.0 - 100.0 mmHg   Bicarbonate 20.3 20.0 - 24.0 mEq/L   TCO2 21 0 - 100 mmol/L   O2 Saturation 98.0 %   Acid-base deficit 3.0 (H) 0.0 - 2.0 mmol/L   Patient temperature 99.6 F    Collection site RADIAL, ALLEN'S TEST ACCEPTABLE    Drawn by RT    Sample type ARTERIAL   Glucose, capillary     Status: Abnormal   Collection Time: 08/23/15  3:40 AM  Result Value Ref Range   Glucose-Capillary 205 (H) 65 - 99 mg/dL   Comment 1 Document in Chart   Glucose, capillary     Status: Abnormal   Collection Time: 08/23/15  7:31 AM  Result Value Ref Range   Glucose-Capillary 240 (H) 65 - 99 mg/dL   Comment 1 Notify RN   Urinalysis with microscopic (not at [facility])     Status: Abnormal   Collection Time: 08/23/15  8:25 AM  Result Value Ref Range   Color, Urine AMBER (A) YELLOW    Comment: BIOCHEMICALS MAY BE AFFECTED BY COLOR   APPearance CLEAR CLEAR   Specific Gravity, Urine 1.020 1.005 - 1.030   pH 5.5 5.0 - 8.0   Glucose, UA NEGATIVE NEGATIVE mg/dL   Hgb urine dipstick SMALL (A) NEGATIVE   Bilirubin Urine SMALL (A) NEGATIVE   Ketones, ur 15 (A) NEGATIVE mg/dL   Protein, ur  NEGATIVE NEGATIVE mg/dL   Nitrite NEGATIVE NEGATIVE   Leukocytes, UA MODERATE (A) NEGATIVE   WBC, UA 6-30 0 - 5 WBC/hpf   RBC / HPF 0-5 0 - 5 RBC/hpf   Bacteria, UA NONE SEEN NONE SEEN   Squamous Epithelial / LPF 0-5 (A) NONE SEEN   Urine-Other LESS THAN 10 mL OF URINE SUBMITTED   Prepare fresh frozen plasma     Status: None (Preliminary result)   Collection Time: 08/23/15  9:12 AM  Result Value Ref Range   Unit Number [id number]    Blood Component Type THAWED PLASMA    Unit division 00    Status of Unit ALLOCATED    Transfusion Status OK TO TRANSFUSE    Unit Number [id number]    Blood Component Type THAWED PLASMA    Unit division 00    Status of Unit ISSUED    Transfusion Status OK TO TRANSFUSE   Lactic acid, plasma     Status: Abnormal   Collection Time: 08/23/15  9:40 AM  Result Value Ref Range   Lactic Acid, Venous 6.1 (HH) 0.5 - 1.9 mmol/L    Comment: CRITICAL RESULT CALLED TO, READ BACK BY AND VERIFIED WITH: SLOODLRN [id number] MCCAULEG   CBC     Status: Abnormal   Collection Time: 08/23/15  9:50 AM  Result Value Ref Range   WBC 9.0 4.0 - 10.5 K/uL   RBC 2.55 (L) 4.22 - 5.81 MIL/uL   Hemoglobin 7.0 (L) 13.0 - 17.0 g/dL   HCT 22.3 (L) 39.0 - 52.0 %   MCV 87.5 78.0 - 100.0 fL   MCH 27.5 26.0 - 34.0 pg   MCHC 31.4 30.0 - 36.0 g/dL   RDW 16.6 (H) 11.5 - 15.5 %   Platelets 43 (L) 150 - 400 K/uL    Comment: CONSISTENT WITH PREVIOUS RESULT  Magnesium     Status: Abnormal   Collection Time: 08/23/15  9:50 AM  Result Value Ref Range   Magnesium 1.0 (L) 1.7 - 2.4 mg/dL    MICRO: 7/28 blood cx ecoli, kleb, plus enterococcus  IMAGING: Dg Chest Port 1 View  Result Date: 08/22/2015 CLINICAL DATA:  Status post intubation and central line placement today. EXAM: PORTABLE CHEST 1 VIEW COMPARISON:  Single-view of the chest earlier today. FINDINGS: Endotracheal tube is in place with the tip in good position 4 cm above the carina. Right IJ catheter tip projects in the  lower superior vena cava. Negative for pneumothorax. Lungs are clear. Heart size is normal. No pleural effusion. IMPRESSION: ETT and right IJ catheter projecting good position. Negative for pneumothorax or acute disease. Electronically Signed   By: Inge Rise M.D.   On: 08/22/2015 11:31  Dg Chest Port 1 View  Result Date: 08/22/2015 CLINICAL DATA:  Acute respiratory failure. EXAM: PORTABLE CHEST 1 VIEW COMPARISON:  None. FINDINGS: The heart size and mediastinal contours are within normal limits. Both lungs are clear. The visualized skeletal structures are unremarkable. IMPRESSION: No active disease. Electronically Signed   By: Fidela Salisbury M.D.   On: 08/22/2015 07:38   Assessment/Plan:  60yo M with likely alcoholic cirrhosis c/b ascites, UGIB from M-W tears, EV, and pHTN presents with sepsis due to UGIB s/p cauterization of vessels related to M-W tears/active bleeding. Found to have polymicrobial bacteremia  UGIB = appears to have stabilized with GI intervention. Continue to monitor Hgb/Hct  Ecoli, kleb, enterococcal bacteremia = translocation of bacteria associated with UGIB, recommend to change to piptazo since that would cover GNR plus amp sensitive enterococcus  SBP prophylaxis = piptazo would also provide coverage  Abdominal distention = mild, thought likely to have ascites. If worsens, consider checking for ascites +/- paracentesis.  transaminitis = likely related to alcohol use but would recommend to check for hep b, hep c and HIV. Repeat hepatic panel.  EV/portal hypertension = does not appear to have been managed for his cirrhosis. once stabilized and later in his hospitalization, may need to have  regimen to address portal hypertension for outpatient management.  Tachycardia = likely multifactorial, from sepsis, alcohol use/ high risk for withdrawal  Health promotion = recommend pneumovax prior to discharge.  Elzie Rings [facility] [phone]

## 2015-08-23 NOTE — Progress Notes (Signed)
eLink Physician-Brief Progress Note Patient Name: Bradley Harris DOB: [birth year] MRN: [id number]   Date of Service  08/23/2015  HPI/Events of Note  Hypocalcemia  eICU Interventions  Calcium replaced     Intervention Category Intermediate Interventions: Electrolyte abnormality - evaluation and management  DETERDING,ELIZABETH 08/23/2015, 5:07 AM

## 2015-08-23 NOTE — Progress Notes (Signed)
CRITICAL VALUE ALERT  Critical value received:  Lactic acid 4.2  Date of notification:  08/23/15  Time of notification:  1239  Critical value read back:Yes.    Nurse who received alert:  Lorin Picket  MD notified (1st page):  Mayo  Time of first page:  1255  MD notified (2nd page):  Time of second page:  Responding MD:  Mayo  Time MD responded:  1255

## 2015-08-23 NOTE — Progress Notes (Signed)
CRITICAL VALUE ALERT  Critical value received:  Lactic 6.1  Date of notification:  08/23/15  Time of notification:  1022  Critical value read back:Yes.    Nurse who received alert:  Lorin Picket  MD notified (1st page):  Dr. Nancy Marus  Time of first page:  1047  MD notified (2nd page):  Time of second page:  Responding MD:  Dr. Nancy Marus  Time MD responded: [phone]

## 2015-08-23 NOTE — Progress Notes (Addendum)
Dr. Darrick Penna made aware of pt's Hb 7.4 this am.  Received order at 0353 to discontinue sodium bicarbonate infusion as ABG pH 7.47, HCO3 20.  Will continue to monitor.

## 2015-08-23 NOTE — Progress Notes (Signed)
[facility]  [address] [place], [place] [address] Phone: [phone] FAX: [phone]   Bradley Harris [id number] [birth year]  CARE TEAM:  PCP: No PCP Per Patient  Outpatient Care Team: Patient Care Team: No Pcp Per Patient as PCP - General (General Practice)  Inpatient Treatment Team: Treatment Team: Attending Provider: Marshell Garfinkel, MD; Attending Physician: Md Pccm, MD; Consulting Physician: Wilford Corner, MD; Registered Nurse: Luci Bank, RN; Respiratory Therapist: Hansel Feinstein, RRT; Consulting Physician: Md Edison Pace, MD; Registered Nurse: Doristine Bosworth, RN; Consulting Physician: Teena Irani, MD  Problem List:   Principal Problem:   Upper gastrointestinal bleeding Active Problems:   Alcoholism ([place])   Depression   Diabetes mellitus without complication ([place])   Hypercholesterolemia   Neuropathy ([place])   Elevated lactic acid level   Thrombocytopenia ([place])   Hemorrhagic shock   Acute respiratory failure ([place])   Portal hypertension with esophageal varices ([place])   1 Day Post-Op  08/22/2015  Procedure(s): ESOPHAGOGASTRODUODENOSCOPY (EGD)   Assessment  MW tear esophagus GI bleed in pt with portal HTN & hepatic dysfunction, 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 & RN.   Recommendations were made.  Questions were answered.  They expressed understanding & appreciation.   Bradley Harris, M.D., F.A.C.S. Gastrointestinal and Minimally Invasive Surgery [facility] [address] [place], [place] [address] [phone] Main /  Paging   08/23/2015  Subjective:  Intubated Dec BP - on pressors Family & ICU RN at bedside   Objective:  Vital signs:  Vitals:   08/23/15 1100 08/23/15 1115 08/23/15 1130 08/23/15 1145  BP: 107/74 119/78 116/69 111/66  Pulse: (!) 127 (!) 122 (!) 121 (!) 118  Resp: 17 15 14 14   Temp:    100.1 F (37.8 C)  TempSrc:    Axillary  SpO2: 96% 98% 98% 98%  Weight:      Height:        Last BM Date: 08/23/15  Intake/Output   Yesterday:  07/28 0701 - 07/29 0700 In: 8196.6 [P.O.:2030; I.V.:4904.6; Blood:602; IV Piggyback:660] Out: 1415 [Urine:1140; Stool:275] This shift:  Total I/O In: 993.2 [I.V.:513.2; Blood:230; IV Piggyback:250] Out: 390 [Urine:390]  Bowel function:  Flatus: No  BM:  Loose  Drain: (No drain)   Physical Exam:  General: Pt intubated, jaundiced Eyes: PERRL, normal EOM.  Sclera icterus Lymph: No head/neck/groin lymphadenopathy HENT: Normocephalic, Mucus membranes moist.  No thrush.  ETT in place Neck: Supple, No tracheal deviation Chest: No chest wall pain w good excursion CV:  Pulses intact.  Regular rhythm MS: Normal AROM mjr joints.  No obvious deformity Abdomen: Somewhat firm.  Mildy distended.  Nontender.  No evidence of peritonitis.  No incarcerated hernias. Ext:  SCDs BLE.    No cyanosis Skin: No petechiae / purpura  Results:   Labs: Results for orders placed or performed during the hospital encounter of 08/22/15 (from the past 48 hour(s))  Sample to Blood Bank     Status: None   Collection Time: 08/22/15  1:40 AM  Result Value Ref Range   Blood Bank Specimen SAMPLE AVAILABLE FOR TESTING    Sample Expiration 08/23/2015   Type  and screen [place]     Status: None   Collection Time: 08/22/15  1:40 AM  Result Value Ref Range   ABO/RH(D) O POS    Antibody Screen NEG    Sample Expiration 08/25/2015    Unit Number [id number]    Blood Component Type RBC LR PHER1    Unit division 00    Status of Unit REL FROM  [facility]    Transfusion Status OK TO TRANSFUSE    Crossmatch Result Compatible    Unit Number [id number]    Blood Component Type RED CELLS,LR    Unit division 00    Status of Unit ISSUED,FINAL    Transfusion Status OK TO TRANSFUSE    Crossmatch Result Compatible    Unit Number [id number]    Blood Component Type RED CELLS,LR    Unit division 00    Status of Unit REL FROM [facility]    Transfusion Status OK TO TRANSFUSE    Crossmatch Result Compatible   ABO/Rh     Status: None   Collection Time: 08/22/15  1:40 AM  Result Value Ref Range   ABO/RH(D) O POS   CBC with Differential     Status: Abnormal   Collection Time: 08/22/15  1:47 AM  Result Value Ref Range   WBC 4.3 4.0 - 10.5 K/uL   RBC 2.97 (L) 4.22 - 5.81 MIL/uL   Hemoglobin 8.1 (L) 13.0 - 17.0 g/dL   HCT 25.7 (L) 39.0 - 52.0 %   MCV 86.5 78.0 - 100.0 fL   MCH 27.3 26.0 - 34.0 pg   MCHC 31.5 30.0 - 36.0 g/dL   RDW 17.5 (H) 11.5 - 15.5 %   Platelets 73 (L) 150 - 400 K/uL    Comment: PLATELET COUNT CONFIRMED BY SMEAR   Neutrophils Relative % 64 %   Lymphocytes Relative 28 %   Monocytes Relative 7 %   Eosinophils Relative 0 %   Basophils Relative 1 %   Neutro Abs 2.8 1.7 - 7.7 K/uL   Lymphs Abs 1.2 0.7 - 4.0 K/uL   Monocytes Absolute 0.3 0.1 - 1.0 K/uL   Eosinophils Absolute 0.0 0.0 - 0.7 K/uL   Basophils Absolute 0.0 0.0 - 0.1 K/uL   RBC Morphology ELLIPTOCYTES    WBC Morphology MILD LEFT SHIFT (1-5% METAS, OCC MYELO, OCC BANDS)   Comprehensive metabolic panel     Status: Abnormal   Collection Time: 08/22/15  1:47 AM  Result Value Ref Range   Sodium 142 135 - 145 mmol/L   Potassium 4.5 3.5 - 5.1 mmol/L   Chloride 101 101 - 111 mmol/L   CO2 19 (L) 22 - 32 mmol/L   Glucose, Bld 166 (H) 65 - 99 mg/dL   BUN 15 6 - 20 mg/dL   Creatinine, Ser 1.13 0.61 - 1.24 mg/dL   Calcium 8.3 (L) 8.9 - 10.3 mg/dL   Total Protein 6.5 6.5 - 8.1 g/dL   Albumin 3.5 3.5 - 5.0 g/dL   AST 240 (H) 15 - 41 U/L   ALT 93 (H) 17 - 63 U/L    Alkaline Phosphatase 107 38 - 126 U/L   Total Bilirubin 2.5 (H) 0.3 - 1.2 mg/dL   GFR calc non Af Amer >60 >60 mL/min   GFR calc Af Amer >60 >60 mL/min    Comment: (NOTE) The eGFR has been calculated using the CKD EPI equation. This calculation has not been validated in all clinical situations. eGFR's persistently <60 mL/min signify  possible Chronic Kidney Disease.    Anion gap 22 (H) 5 - 15    Comment: RESULT CHECKED  Ethanol     Status: Abnormal   Collection Time: 08/22/15  1:47 AM  Result Value Ref Range   Alcohol, Ethyl (B) 316 (HH) <5 mg/dL    Comment:        LOWEST DETECTABLE LIMIT FOR SERUM ALCOHOL IS 5 mg/dL FOR MEDICAL PURPOSES ONLY CRITICAL RESULT CALLED TO, READ BACK BY AND VERIFIED WITH: STRAUGHN,K RN 08/22/2015 0303 JORDANS   Protime-INR     Status: Abnormal   Collection Time: 08/22/15  1:47 AM  Result Value Ref Range   Prothrombin Time 16.9 (H) 11.4 - 15.2 seconds   INR 1.36   Pathologist smear review     Status: None   Collection Time: 08/22/15  1:47 AM  Result Value Ref Range   Path Review Normocytic anemia.Thrombocytopenia     Comment: Reviewed by Kalman Drape, M.D. [phone]   I-Stat CG4 Lactic Acid, ED     Status: Abnormal   Collection Time: 08/22/15  2:31 AM  Result Value Ref Range   Lactic Acid, Venous 8.00 (HH) 0.5 - 1.9 mmol/L   Comment NOTIFIED PHYSICIAN   Prepare RBC     Status: None   Collection Time: 08/22/15  4:05 AM  Result Value Ref Range   Order Confirmation ORDER PROCESSED BY BLOOD BANK   Hemoglobin A1c     Status: Abnormal   Collection Time: 08/22/15  4:50 AM  Result Value Ref Range   Hgb A1c MFr Bld 7.2 (H) 4.8 - 5.6 %    Comment: (NOTE)         Pre-diabetes: 5.7 - 6.4         Diabetes: >6.4         Glycemic control for adults with diabetes: <7.0    Mean Plasma Glucose 160 mg/dL    Comment: (NOTE) Performed At: [facility] [place], [place] [id number] Lindon Romp MD TI:[id number]   CBC     Status:  Abnormal   Collection Time: 08/22/15  6:08 AM  Result Value Ref Range   WBC 5.1 4.0 - 10.5 K/uL   RBC 2.29 (L) 4.22 - 5.81 MIL/uL   Hemoglobin 6.3 (LL) 13.0 - 17.0 g/dL    Comment: REPEATED TO VERIFY CRITICAL RESULT CALLED TO, READ BACK BY AND VERIFIED WITH: HILL,J RN @ 2505 08/22/15 LEONARD,A    HCT 20.0 (L) 39.0 - 52.0 %   MCV 87.3 78.0 - 100.0 fL   MCH 27.5 26.0 - 34.0 pg   MCHC 31.5 30.0 - 36.0 g/dL   RDW 17.7 (H) 11.5 - 15.5 %   Platelets 73 (L) 150 - 400 K/uL    Comment: CONSISTENT WITH PREVIOUS RESULT  Comprehensive metabolic panel     Status: Abnormal   Collection Time: 08/22/15  6:08 AM  Result Value Ref Range   Sodium 143 135 - 145 mmol/L   Potassium 4.6 3.5 - 5.1 mmol/L   Chloride 102 101 - 111 mmol/L   CO2 15 (L) 22 - 32 mmol/L   Glucose, Bld 149 (H) 65 - 99 mg/dL   BUN 15 6 - 20 mg/dL   Creatinine, Ser 1.03 0.61 - 1.24 mg/dL   Calcium 8.1 (L) 8.9 - 10.3 mg/dL   Total Protein 6.3 (L) 6.5 - 8.1 g/dL   Albumin 3.4 (L) 3.5 - 5.0 g/dL   AST 304 (H) 15 - 41 U/L  ALT 114 (H) 17 - 63 U/L   Alkaline Phosphatase 99 38 - 126 U/L   Total Bilirubin 2.4 (H) 0.3 - 1.2 mg/dL   GFR calc non Af Amer >60 >60 mL/min   GFR calc Af Amer >60 >60 mL/min    Comment: (NOTE) The eGFR has been calculated using the CKD EPI equation. This calculation has not been validated in all clinical situations. eGFR's persistently <60 mL/min signify possible Chronic Kidney Disease.    Anion gap 26 (H) 5 - 15  Lactic acid, plasma     Status: Abnormal   Collection Time: 08/22/15  6:08 AM  Result Value Ref Range   Lactic Acid, Venous 8.8 (HH) 0.5 - 1.9 mmol/L    Comment: CRITICAL RESULT CALLED TO, READ BACK BY AND VERIFIED WITH: JASMINE HILL,RN AT 0711 08/22/15 BY ZBEECH.   Transfusion reaction     Status: None   Collection Time: 08/22/15  6:19 AM  Result Value Ref Range   Post RXN DAT IgG NEG    DAT C3 NEG    Path interp tx rxn      No laboratory evidence of hemolytic transfusion reaction.  Reviewed by Dr. Claudette Laws 08/22/15.  Type and screen [place]     Status: None (Preliminary result)   Collection Time: 08/22/15  6:43 AM  Result Value Ref Range   ABO/RH(D) O POS    Antibody Screen NEG    Sample Expiration 08/25/2015    Unit Number [id number]    Blood Component Type RBC LR PHER1    Unit division 00    Status of Unit ISSUED,FINAL    Transfusion Status OK TO TRANSFUSE    Crossmatch Result Compatible    Unit Number [id number]    Blood Component Type RED CELLS,LR    Unit division 00    Status of Unit ISSUED,FINAL    Transfusion Status OK TO TRANSFUSE    Crossmatch Result Compatible    Unit Number [id number]    Blood Component Type RED CELLS,LR    Unit division 00    Status of Unit ISSUED,FINAL    Transfusion Status OK TO TRANSFUSE    Crossmatch Result Compatible    Unit Number [id number]    Blood Component Type RED CELLS,LR    Unit division 00    Status of Unit ISSUED,FINAL    Transfusion Status OK TO TRANSFUSE    Crossmatch Result Compatible    Unit Number [id number]    Blood Component Type RED CELLS,LR    Unit division 00    Status of Unit ALLOCATED    Transfusion Status OK TO TRANSFUSE    Crossmatch Result Compatible    Unit Number [id number]    Blood Component Type RED CELLS,LR    Unit division 00    Status of Unit ALLOCATED    Transfusion Status OK TO TRANSFUSE    Crossmatch Result Compatible   Prepare RBC     Status: None   Collection Time: 08/22/15  6:43 AM  Result Value Ref Range   Order Confirmation ORDER PROCESSED BY BLOOD BANK   Comprehensive metabolic panel     Status: Abnormal   Collection Time: 08/22/15  7:42 AM  Result Value Ref Range   Sodium 144 135 - 145 mmol/L   Potassium 5.6 (H) 3.5 - 5.1 mmol/L   Chloride 110 101 - 111 mmol/L   CO2 12 (L) 22 - 32 mmol/L   Glucose, Bld 163 (H) 65 - 99 mg/dL  BUN 17 6 - 20 mg/dL   Creatinine, Ser 1.02 0.61 - 1.24 mg/dL   Calcium 7.2 (L) 8.9 - 10.3 mg/dL    Total Protein 5.6 (L) 6.5 - 8.1 g/dL   Albumin 2.9 (L) 3.5 - 5.0 g/dL   AST 763 (H) 15 - 41 U/L   ALT 282 (H) 17 - 63 U/L   Alkaline Phosphatase 84 38 - 126 U/L   Total Bilirubin 2.6 (H) 0.3 - 1.2 mg/dL   GFR calc non Af Amer >60 >60 mL/min   GFR calc Af Amer >60 >60 mL/min    Comment: (NOTE) The eGFR has been calculated using the CKD EPI equation. This calculation has not been validated in all clinical situations. eGFR's persistently <60 mL/min signify possible Chronic Kidney Disease.    Anion gap 22 (H) 5 - 15  Magnesium     Status: Abnormal   Collection Time: 08/22/15  7:42 AM  Result Value Ref Range   Magnesium 1.5 (L) 1.7 - 2.4 mg/dL  Phosphorus     Status: Abnormal   Collection Time: 08/22/15  7:42 AM  Result Value Ref Range   Phosphorus 5.4 (H) 2.5 - 4.6 mg/dL  Amylase     Status: None   Collection Time: 08/22/15  7:42 AM  Result Value Ref Range   Amylase 38 28 - 100 U/L  Lipase, blood     Status: None   Collection Time: 08/22/15  7:42 AM  Result Value Ref Range   Lipase 42 11 - 51 U/L  Troponin I     Status: None   Collection Time: 08/22/15  7:42 AM  Result Value Ref Range   Troponin I <0.03 <0.03 ng/mL  Procalcitonin     Status: None   Collection Time: 08/22/15  7:42 AM  Result Value Ref Range   Procalcitonin 0.18 ng/mL    Comment:        Interpretation: PCT (Procalcitonin) <= 0.5 ng/mL: Systemic infection (sepsis) is not likely. Local bacterial infection is possible. (NOTE)         ICU PCT Algorithm               Non ICU PCT Algorithm    ----------------------------     ------------------------------         PCT < 0.25 ng/mL                 PCT < 0.1 ng/mL     Stopping of antibiotics            Stopping of antibiotics       strongly encouraged.               strongly encouraged.    ----------------------------     ------------------------------       PCT level decrease by               PCT < 0.25 ng/mL       >= 80% from peak PCT       OR PCT 0.25 -  0.5 ng/mL          Stopping of antibiotics                                             encouraged.     Stopping of antibiotics           encouraged.    ----------------------------     ------------------------------  PCT level decrease by              PCT >= 0.25 ng/mL       < 80% from peak PCT        AND PCT >= 0.5 ng/mL            Continuin g antibiotics                                              encouraged.       Continuing antibiotics            encouraged.    ----------------------------     ------------------------------     PCT level increase compared          PCT > 0.5 ng/mL         with peak PCT AND          PCT >= 0.5 ng/mL             Escalation of antibiotics                                          strongly encouraged.      Escalation of antibiotics        strongly encouraged.   Brain natriuretic peptide     Status: None   Collection Time: 08/22/15  7:42 AM  Result Value Ref Range   B Natriuretic Peptide 14.7 0.0 - 100.0 pg/mL  Cortisol     Status: None   Collection Time: 08/22/15  7:42 AM  Result Value Ref Range   Cortisol, Plasma 37.2 ug/dL    Comment: (NOTE) AM    6.7 - 22.6 ug/dL PM   <10.0       ug/dL   CBC WITH DIFFERENTIAL     Status: Abnormal   Collection Time: 08/22/15  7:42 AM  Result Value Ref Range   WBC 4.5 4.0 - 10.5 K/uL   RBC 2.17 (L) 4.22 - 5.81 MIL/uL   Hemoglobin 5.8 (LL) 13.0 - 17.0 g/dL    Comment: REPEATED TO VERIFY CRITICAL VALUE NOTED.  VALUE IS CONSISTENT WITH PREVIOUSLY REPORTED AND CALLED VALUE.    HCT 19.3 (L) 39.0 - 52.0 %   MCV 88.9 78.0 - 100.0 fL   MCH 26.7 26.0 - 34.0 pg   MCHC 30.1 30.0 - 36.0 g/dL   RDW 17.7 (H) 11.5 - 15.5 %   Platelets 61 (L) 150 - 400 K/uL    Comment: CONSISTENT WITH PREVIOUS RESULT REPEATED TO VERIFY    Neutrophils Relative % 79 %   Lymphocytes Relative 11 %   Monocytes Relative 10 %   Eosinophils Relative 0 %   Basophils Relative 0 %   Neutro Abs 3.5 1.7 - 7.7 K/uL   Lymphs Abs 0.5 (L)  0.7 - 4.0 K/uL   Monocytes Absolute 0.5 0.1 - 1.0 K/uL   Eosinophils Absolute 0.0 0.0 - 0.7 K/uL   Basophils Absolute 0.0 0.0 - 0.1 K/uL   RBC Morphology TEARDROP CELLS     Comment: ELLIPTOCYTES  Protime-INR     Status: Abnormal   Collection Time: 08/22/15  7:42 AM  Result Value Ref Range   Prothrombin Time 21.1 (H) 11.4 - 15.2 seconds   INR 1.80  APTT     Status: None   Collection Time: 08/22/15  7:42 AM  Result Value Ref Range   aPTT 28 24 - 36 seconds  D-dimer, quantitative (not at [facility])     Status: Abnormal   Collection Time: 08/22/15  7:42 AM  Result Value Ref Range   D-Dimer, Quant 3.29 (H) 0.00 - 0.50 ug/mL-FEU    Comment: (NOTE) At the manufacturer cut-off of 0.50 ug/mL FEU, this assay has been documented to exclude PE with a sensitivity and negative predictive value of 97 to 99%.  At this time, this assay has not been approved by the FDA to exclude DVT/VTE. Results should be correlated with clinical presentation.   Lactic acid, plasma     Status: Abnormal   Collection Time: 08/22/15  7:42 AM  Result Value Ref Range   Lactic Acid, Venous 9.6 (HH) 0.5 - 1.9 mmol/L    Comment: CRITICAL RESULT CALLED TO, READ BACK BY AND VERIFIED WITH: L.FLOOD,RN 0946 08/22/15 [place],S   Culture, blood (routine x 2)     Status: None (Preliminary result)   Collection Time: 08/22/15  7:45 AM  Result Value Ref Range   Specimen Description BLOOD LEFT ANTECUBITAL    Special Requests BOTTLES DRAWN AEROBIC AND ANAEROBIC 5CC    Culture  Setup Time      GRAM NEGATIVE RODS GRAM POSITIVE COCCI IN PAIRS AND CHAINS IN BOTH AEROBIC AND ANAEROBIC BOTTLES Organism ID to follow CRITICAL RESULT CALLED TO, READ BACK BY AND VERIFIED WITH: V BRYK PHARMD 2254 08/22/15 A BROWNING    Culture GRAM NEGATIVE RODS GRAM POSITIVE COCCI     Report Status PENDING   Blood Culture ID Panel (Reflexed)     Status: Abnormal   Collection Time: 08/22/15  7:45 AM  Result Value Ref Range   Enterococcus species DETECTED  (A) NOT DETECTED    Comment: CRITICAL RESULT CALLED TO, READ BACK BY AND VERIFIED WITH: V BRYK PHARMD 2254 08/22/15 A BROWNING    Vancomycin resistance NOT DETECTED NOT DETECTED   Listeria monocytogenes NOT DETECTED NOT DETECTED   Staphylococcus species NOT DETECTED NOT DETECTED   Staphylococcus aureus NOT DETECTED NOT DETECTED   Methicillin resistance NOT DETECTED NOT DETECTED   Streptococcus species NOT DETECTED NOT DETECTED   Streptococcus agalactiae NOT DETECTED NOT DETECTED   Streptococcus pneumoniae NOT DETECTED NOT DETECTED   Streptococcus pyogenes NOT DETECTED NOT DETECTED   Acinetobacter baumannii NOT DETECTED NOT DETECTED   Enterobacteriaceae species DETECTED (A) NOT DETECTED    Comment: CRITICAL RESULT CALLED TO, READ BACK BY AND VERIFIED WITH: V BRYK PHARMD 2254 08/22/15 A BROWNING    Enterobacter cloacae complex NOT DETECTED NOT DETECTED   Escherichia coli DETECTED (A) NOT DETECTED    Comment: CRITICAL RESULT CALLED TO, READ BACK BY AND VERIFIED WITH: V BRYK PHARMD 2254 08/22/15 A BROWNING    Klebsiella oxytoca NOT DETECTED NOT DETECTED   Klebsiella pneumoniae DETECTED (A) NOT DETECTED    Comment: CRITICAL RESULT CALLED TO, READ BACK BY AND VERIFIED WITH: V BRYK PHARMD 2254 08/22/15 A BROWNING    Proteus species NOT DETECTED NOT DETECTED   Serratia marcescens NOT DETECTED NOT DETECTED   Carbapenem resistance NOT DETECTED NOT DETECTED   Haemophilus influenzae NOT DETECTED NOT DETECTED   Neisseria meningitidis NOT DETECTED NOT DETECTED   Pseudomonas aeruginosa NOT DETECTED NOT DETECTED   Candida albicans NOT DETECTED NOT DETECTED   Candida glabrata NOT DETECTED NOT DETECTED   Candida krusei NOT DETECTED NOT DETECTED  Candida parapsilosis NOT DETECTED NOT DETECTED   Candida tropicalis NOT DETECTED NOT DETECTED  Prepare RBC     Status: None   Collection Time: 08/22/15  7:46 AM  Result Value Ref Range   Order Confirmation ORDER PROCESSED BY BLOOD BANK   Glucose,  capillary     Status: Abnormal   Collection Time: 08/22/15  9:06 AM  Result Value Ref Range   Glucose-Capillary 142 (H) 65 - 99 mg/dL   Comment 1 Notify RN    Comment 2 Document in Chart   Glucose, capillary     Status: Abnormal   Collection Time: 08/22/15 11:11 AM  Result Value Ref Range   Glucose-Capillary 151 (H) 65 - 99 mg/dL   Comment 1 Notify RN    Comment 2 Document in Chart   CBC     Status: Abnormal   Collection Time: 08/22/15 11:55 AM  Result Value Ref Range   WBC 7.3 4.0 - 10.5 K/uL   RBC 2.60 (L) 4.22 - 5.81 MIL/uL   Hemoglobin 7.1 (L) 13.0 - 17.0 g/dL   HCT 23.3 (L) 39.0 - 52.0 %   MCV 89.6 78.0 - 100.0 fL   MCH 27.3 26.0 - 34.0 pg   MCHC 30.5 30.0 - 36.0 g/dL   RDW 16.8 (H) 11.5 - 15.5 %   Platelets 69 (L) 150 - 400 K/uL    Comment: CONSISTENT WITH PREVIOUS RESULT  Basic metabolic panel     Status: Abnormal   Collection Time: 08/22/15 12:00 PM  Result Value Ref Range   Sodium 145 135 - 145 mmol/L   Potassium 5.7 (H) 3.5 - 5.1 mmol/L   Chloride 111 101 - 111 mmol/L   CO2 14 (L) 22 - 32 mmol/L   Glucose, Bld 161 (H) 65 - 99 mg/dL   BUN 17 6 - 20 mg/dL   Creatinine, Ser 0.99 0.61 - 1.24 mg/dL   Calcium 7.2 (L) 8.9 - 10.3 mg/dL   GFR calc non Af Amer >60 >60 mL/min   GFR calc Af Amer >60 >60 mL/min    Comment: (NOTE) The eGFR has been calculated using the CKD EPI equation. This calculation has not been validated in all clinical situations. eGFR's persistently <60 mL/min signify possible Chronic Kidney Disease.    Anion gap 20 (H) 5 - 15  Prepare RBC     Status: None   Collection Time: 08/22/15 12:19 PM  Result Value Ref Range   Order Confirmation ORDER PROCESSED BY BLOOD BANK   Prepare RBC     Status: None   Collection Time: 08/22/15 12:59 PM  Result Value Ref Range   Order Confirmation ORDER PROCESSED BY BLOOD BANK   MRSA PCR Screening     Status: None   Collection Time: 08/22/15  1:20 PM  Result Value Ref Range   MRSA by PCR NEGATIVE NEGATIVE     Comment:        The GeneXpert MRSA Assay (FDA approved for NASAL specimens only), is one component of a comprehensive MRSA colonization surveillance program. It is not intended to diagnose MRSA infection nor to guide or monitor treatment for MRSA infections.   CBC     Status: Abnormal   Collection Time: 08/22/15  1:45 PM  Result Value Ref Range   WBC 9.3 4.0 - 10.5 K/uL   RBC 3.19 (L) 4.22 - 5.81 MIL/uL   Hemoglobin 8.7 (L) 13.0 - 17.0 g/dL   HCT 28.8 (L) 39.0 - 52.0 %   MCV 90.3  78.0 - 100.0 fL   MCH 27.3 26.0 - 34.0 pg   MCHC 30.2 30.0 - 36.0 g/dL   RDW 15.6 (H) 11.5 - 15.5 %   Platelets 79 (L) 150 - 400 K/uL    Comment: CONSISTENT WITH PREVIOUS RESULT  Lactic acid, plasma     Status: Abnormal   Collection Time: 08/22/15  1:45 PM  Result Value Ref Range   Lactic Acid, Venous 9.8 (HH) 0.5 - 1.9 mmol/L    Comment: CRITICAL RESULT CALLED TO, READ BACK BY AND VERIFIED WITH: LINDSAY FLOOD,RN AT 1440 08/22/15 BY ZBEECH.   I-STAT 3, arterial blood gas (G3+)     Status: Abnormal   Collection Time: 08/22/15  2:16 PM  Result Value Ref Range   pH, Arterial 7.096 (LL) 7.350 - 7.450   pCO2 arterial 30.2 (L) 35.0 - 45.0 mmHg   pO2, Arterial 249.0 (H) 80.0 - 100.0 mmHg   Bicarbonate 9.3 (L) 20.0 - 24.0 mEq/L   TCO2 10 0 - 100 mmol/L   O2 Saturation 100.0 %   Acid-base deficit 19.0 (H) 0.0 - 2.0 mmol/L   Patient temperature 98.6 F    Collection site RADIAL, ALLEN'S TEST ACCEPTABLE    Drawn by Operator    Sample type ARTERIAL    Comment NOTIFIED PHYSICIAN   Glucose, capillary     Status: Abnormal   Collection Time: 08/22/15  3:20 PM  Result Value Ref Range   Glucose-Capillary 189 (H) 65 - 99 mg/dL   Comment 1 Notify RN    Comment 2 Document in Chart   CBC     Status: Abnormal   Collection Time: 08/22/15  3:30 PM  Result Value Ref Range   WBC 14.2 (H) 4.0 - 10.5 K/uL   RBC 3.15 (L) 4.22 - 5.81 MIL/uL   Hemoglobin 8.7 (L) 13.0 - 17.0 g/dL   HCT 28.3 (L) 39.0 - 52.0 %   MCV  89.8 78.0 - 100.0 fL   MCH 27.6 26.0 - 34.0 pg   MCHC 30.7 30.0 - 36.0 g/dL   RDW 16.1 (H) 11.5 - 15.5 %   Platelets 80 (L) 150 - 400 K/uL    Comment: REPEATED TO VERIFY SPECIMEN CHECKED FOR CLOTS CONSISTENT WITH PREVIOUS RESULT   I-STAT 3, arterial blood gas (G3+)     Status: Abnormal   Collection Time: 08/22/15  6:28 PM  Result Value Ref Range   pH, Arterial 7.145 (LL) 7.350 - 7.450   pCO2 arterial 33.5 (L) 35.0 - 45.0 mmHg   pO2, Arterial 165.0 (H) 80.0 - 100.0 mmHg   Bicarbonate 11.5 (L) 20.0 - 24.0 mEq/L   TCO2 13 0 - 100 mmol/L   O2 Saturation 99.0 %   Acid-base deficit 16.0 (H) 0.0 - 2.0 mmol/L   Patient temperature 98.7 F    Collection site RADIAL, ALLEN'S TEST ACCEPTABLE    Drawn by Operator    Sample type ARTERIAL    Comment NOTIFIED PHYSICIAN   I-STAT 3, arterial blood gas (G3+)     Status: Abnormal   Collection Time: 08/22/15  7:43 PM  Result Value Ref Range   pH, Arterial 7.278 (L) 7.350 - 7.450   pCO2 arterial 27.2 (L) 35.0 - 45.0 mmHg   pO2, Arterial 177.0 (H) 80.0 - 100.0 mmHg   Bicarbonate 12.6 (L) 20.0 - 24.0 mEq/L   TCO2 13 0 - 100 mmol/L   O2 Saturation 99.0 %   Acid-base deficit 13.0 (H) 0.0 - 2.0 mmol/L   Patient temperature  99.6 F    Collection site RADIAL, ALLEN'S TEST ACCEPTABLE    Drawn by RT    Sample type ARTERIAL   Glucose, capillary     Status: Abnormal   Collection Time: 08/22/15  7:58 PM  Result Value Ref Range   Glucose-Capillary 202 (H) 65 - 99 mg/dL   Comment 1 Document in Chart   CBC     Status: Abnormal   Collection Time: 08/22/15 10:40 PM  Result Value Ref Range   WBC 13.1 (H) 4.0 - 10.5 K/uL   RBC 2.92 (L) 4.22 - 5.81 MIL/uL   Hemoglobin 7.9 (L) 13.0 - 17.0 g/dL   HCT 25.6 (L) 39.0 - 52.0 %   MCV 87.7 78.0 - 100.0 fL   MCH 27.1 26.0 - 34.0 pg   MCHC 30.9 30.0 - 36.0 g/dL   RDW 16.5 (H) 11.5 - 15.5 %   Platelets 74 (L) 150 - 400 K/uL    Comment: SPECIMEN CHECKED FOR CLOTS REPEATED TO VERIFY CONSISTENT WITH PREVIOUS  RESULT   Glucose, capillary     Status: Abnormal   Collection Time: 08/23/15 12:16 AM  Result Value Ref Range   Glucose-Capillary 197 (H) 65 - 99 mg/dL   Comment 1 Document in Chart   CBC     Status: Abnormal   Collection Time: 08/23/15  2:59 AM  Result Value Ref Range   WBC 11.7 (H) 4.0 - 10.5 K/uL   RBC 2.69 (L) 4.22 - 5.81 MIL/uL   Hemoglobin 7.4 (L) 13.0 - 17.0 g/dL   HCT 23.5 (L) 39.0 - 52.0 %   MCV 87.4 78.0 - 100.0 fL   MCH 27.5 26.0 - 34.0 pg   MCHC 31.5 30.0 - 36.0 g/dL   RDW 16.6 (H) 11.5 - 15.5 %   Platelets 59 (L) 150 - 400 K/uL  Procalcitonin     Status: None   Collection Time: 08/23/15  2:59 AM  Result Value Ref Range   Procalcitonin 2.08 ng/mL    Comment:        Interpretation: PCT > 2 ng/mL: Systemic infection (sepsis) is likely, unless other causes are known. (NOTE)         ICU PCT Algorithm               Non ICU PCT Algorithm    ----------------------------     ------------------------------         PCT < 0.25 ng/mL                 PCT < 0.1 ng/mL     Stopping of antibiotics            Stopping of antibiotics       strongly encouraged.               strongly encouraged.    ----------------------------     ------------------------------       PCT level decrease by               PCT < 0.25 ng/mL       >= 80% from peak PCT       OR PCT 0.25 - 0.5 ng/mL          Stopping of antibiotics                                             encouraged.  Stopping of antibiotics           encouraged.    ----------------------------     ------------------------------       PCT level decrease by              PCT >= 0.25 ng/mL       < 80% from peak PCT        AND PCT >= 0.5 ng/mL            Continuing antibiotics                                               encouraged.       Continuing antibiotics            encouraged.    ----------------------------     ------------------------------     PCT level increase compared          PCT > 0.5 ng/mL         with peak PCT  AND          PCT >= 0.5 ng/mL             Escalation of antibiotics                                          strongly encouraged.      Escalation of antibiotics        strongly encouraged.   Lactic acid, plasma     Status: Abnormal   Collection Time: 08/23/15  2:59 AM  Result Value Ref Range   Lactic Acid, Venous 8.1 (HH) 0.5 - 1.9 mmol/L    Comment: CRITICAL RESULT CALLED TO, READ BACK BY AND VERIFIED WITH: WOODSON,B RN 08/23/2015 0324 JORDANS   Comprehensive metabolic panel     Status: Abnormal   Collection Time: 08/23/15  2:59 AM  Result Value Ref Range   Sodium 143 135 - 145 mmol/L   Potassium 4.6 3.5 - 5.1 mmol/L    Comment: DELTA CHECK NOTED   Chloride 107 101 - 111 mmol/L   CO2 20 (L) 22 - 32 mmol/L   Glucose, Bld 201 (H) 65 - 99 mg/dL   BUN 25 (H) 6 - 20 mg/dL   Creatinine, Ser 1.60 (H) 0.61 - 1.24 mg/dL   Calcium 6.3 (LL) 8.9 - 10.3 mg/dL    Comment: CRITICAL RESULT CALLED TO, READ BACK BY AND VERIFIED WITH: WOODSON,B RN 0409 7.29.17 MCADOO,G    Total Protein 4.7 (L) 6.5 - 8.1 g/dL   Albumin 2.5 (L) 3.5 - 5.0 g/dL   AST 1,884 (H) 15 - 41 U/L   ALT 531 (H) 17 - 63 U/L   Alkaline Phosphatase 64 38 - 126 U/L   Total Bilirubin 4.2 (H) 0.3 - 1.2 mg/dL   GFR calc non Af Amer 46 (L) >60 mL/min   GFR calc Af Amer 54 (L) >60 mL/min    Comment: (NOTE) The eGFR has been calculated using the CKD EPI equation. This calculation has not been validated in all clinical situations. eGFR's persistently <60 mL/min signify possible Chronic Kidney Disease.    Anion gap 16 (H) 5 - 15  Protime-INR     Status: Abnormal   Collection Time: 08/23/15  2:59 AM  Result Value  Ref Range   Prothrombin Time 24.0 (H) 11.4 - 15.2 seconds   INR 2.11   I-STAT 3, arterial blood gas (G3+)     Status: Abnormal   Collection Time: 08/23/15  3:28 AM  Result Value Ref Range   pH, Arterial 7.470 (H) 7.350 - 7.450   pCO2 arterial 28.1 (L) 35.0 - 45.0 mmHg   pO2, Arterial 96.0 80.0 - 100.0 mmHg    Bicarbonate 20.3 20.0 - 24.0 mEq/L   TCO2 21 0 - 100 mmol/L   O2 Saturation 98.0 %   Acid-base deficit 3.0 (H) 0.0 - 2.0 mmol/L   Patient temperature 99.6 F    Collection site RADIAL, ALLEN'S TEST ACCEPTABLE    Drawn by RT    Sample type ARTERIAL   Glucose, capillary     Status: Abnormal   Collection Time: 08/23/15  3:40 AM  Result Value Ref Range   Glucose-Capillary 205 (H) 65 - 99 mg/dL   Comment 1 Document in Chart   Glucose, capillary     Status: Abnormal   Collection Time: 08/23/15  7:31 AM  Result Value Ref Range   Glucose-Capillary 240 (H) 65 - 99 mg/dL   Comment 1 Notify RN   Urinalysis with microscopic (not at [facility])     Status: Abnormal   Collection Time: 08/23/15  8:25 AM  Result Value Ref Range   Color, Urine AMBER (A) YELLOW    Comment: BIOCHEMICALS MAY BE AFFECTED BY COLOR   APPearance CLEAR CLEAR   Specific Gravity, Urine 1.020 1.005 - 1.030   pH 5.5 5.0 - 8.0   Glucose, UA NEGATIVE NEGATIVE mg/dL   Hgb urine dipstick SMALL (A) NEGATIVE   Bilirubin Urine SMALL (A) NEGATIVE   Ketones, ur 15 (A) NEGATIVE mg/dL   Protein, ur NEGATIVE NEGATIVE mg/dL   Nitrite NEGATIVE NEGATIVE   Leukocytes, UA MODERATE (A) NEGATIVE   WBC, UA 6-30 0 - 5 WBC/hpf   RBC / HPF 0-5 0 - 5 RBC/hpf   Bacteria, UA NONE SEEN NONE SEEN   Squamous Epithelial / LPF 0-5 (A) NONE SEEN   Urine-Other LESS THAN 10 mL OF URINE SUBMITTED   Prepare fresh frozen plasma     Status: None (Preliminary result)   Collection Time: 08/23/15  9:12 AM  Result Value Ref Range   Unit Number [id number]    Blood Component Type THAWED PLASMA    Unit division 00    Status of Unit ISSUED    Transfusion Status OK TO TRANSFUSE    Unit Number [id number]    Blood Component Type THAWED PLASMA    Unit division 00    Status of Unit ISSUED    Transfusion Status OK TO TRANSFUSE   Lactic acid, plasma     Status: Abnormal   Collection Time: 08/23/15  9:40 AM  Result Value Ref Range   Lactic Acid, Venous 6.1 (HH)  0.5 - 1.9 mmol/L    Comment: CRITICAL RESULT CALLED TO, READ BACK BY AND VERIFIED WITH: SLOODLRN [id number] MCCAULEG   CBC     Status: Abnormal   Collection Time: 08/23/15  9:50 AM  Result Value Ref Range   WBC 9.0 4.0 - 10.5 K/uL   RBC 2.55 (L) 4.22 - 5.81 MIL/uL   Hemoglobin 7.0 (L) 13.0 - 17.0 g/dL   HCT 22.3 (L) 39.0 - 52.0 %   MCV 87.5 78.0 - 100.0 fL   MCH 27.5 26.0 - 34.0 pg   MCHC 31.4 30.0 - 36.0 g/dL  RDW 16.6 (H) 11.5 - 15.5 %   Platelets 43 (L) 150 - 400 K/uL    Comment: CONSISTENT WITH PREVIOUS RESULT  Magnesium     Status: Abnormal   Collection Time: 08/23/15  9:50 AM  Result Value Ref Range   Magnesium 1.0 (L) 1.7 - 2.4 mg/dL    Imaging / Studies: Dg Chest Port 1 View  Result Date: 08/22/2015 CLINICAL DATA:  Status post intubation and central line placement today. EXAM: PORTABLE CHEST 1 VIEW COMPARISON:  Single-view of the chest earlier today. FINDINGS: Endotracheal tube is in place with the tip in good position 4 cm above the carina. Right IJ catheter tip projects in the lower superior vena cava. Negative for pneumothorax. Lungs are clear. Heart size is normal. No pleural effusion. IMPRESSION: ETT and right IJ catheter projecting good position. Negative for pneumothorax or acute disease. Electronically Signed   By: Inge Rise M.D.   On: 08/22/2015 11:31  Dg Chest Port 1 View  Result Date: 08/22/2015 CLINICAL DATA:  Acute respiratory failure. EXAM: PORTABLE CHEST 1 VIEW COMPARISON:  None. FINDINGS: The heart size and mediastinal contours are within normal limits. Both lungs are clear. The visualized skeletal structures are unremarkable. IMPRESSION: No active disease. Electronically Signed   By: Fidela Salisbury M.D.   On: 08/22/2015 07:38   Medications / Allergies: per chart  Antibiotics: Anti-infectives    Start     Dose/Rate Route Frequency Ordered Stop   08/23/15 2200  cefTRIAXone (ROCEPHIN) 2 g in dextrose 5 % 50 mL IVPB     2 g 100 mL/hr over 30  Minutes Intravenous Every 24 hours 08/22/15 0658     08/23/15 0800  vancomycin (VANCOCIN) IVPB 1000 mg/200 mL premix     1,000 mg 200 mL/hr over 60 Minutes Intravenous Every 8 hours 08/22/15 2317     08/22/15 2330  cefTRIAXone (ROCEPHIN) 2 g in dextrose 5 % 50 mL IVPB     2 g 100 mL/hr over 30 Minutes Intravenous STAT 08/22/15 2317 08/23/15 0040   08/22/15 2330  vancomycin (VANCOCIN) 2,000 mg in sodium chloride 0.9 % 500 mL IVPB     2,000 mg 250 mL/hr over 120 Minutes Intravenous  Once 08/22/15 2317 08/23/15 0230   08/22/15 0700  cefTRIAXone (ROCEPHIN) 2 g in dextrose 5 % 50 mL IVPB  Status:  Discontinued     2 g 100 mL/hr over 30 Minutes Intravenous NOW 08/22/15 0658 08/22/15 1841        Note: Portions of this report may have been transcribed using voice recognition software. Every effort was made to ensure accuracy; however, inadvertent computerized transcription errors may be present.   Any transcriptional errors that result from this process are unintentional.     Bradley Harris, M.D., F.A.C.S. Gastrointestinal and Minimally Invasive Surgery [facility] [address] [place], [place] [address] [phone] Main / Paging   08/23/2015

## 2015-08-24 ENCOUNTER — Inpatient Hospital Stay (HOSPITAL_COMMUNITY): Payer: Self-pay

## 2015-08-24 DIAGNOSIS — B952 Enterococcus as the cause of diseases classified elsewhere: Secondary | ICD-10-CM

## 2015-08-24 DIAGNOSIS — F1020 Alcohol dependence, uncomplicated: Secondary | ICD-10-CM

## 2015-08-24 DIAGNOSIS — F329 Major depressive disorder, single episode, unspecified: Secondary | ICD-10-CM

## 2015-08-24 DIAGNOSIS — K254 Chronic or unspecified gastric ulcer with hemorrhage: Secondary | ICD-10-CM

## 2015-08-24 LAB — COMPREHENSIVE METABOLIC PANEL
ALBUMIN: 2.4 g/dL — AB (ref 3.5–5.0)
ALT: 352 U/L — AB (ref 17–63)
AST: 594 U/L — AB (ref 15–41)
Alkaline Phosphatase: 47 U/L (ref 38–126)
Anion gap: 6 (ref 5–15)
BUN: 29 mg/dL — AB (ref 6–20)
CALCIUM: 6.7 mg/dL — AB (ref 8.9–10.3)
CHLORIDE: 110 mmol/L (ref 101–111)
CO2: 29 mmol/L (ref 22–32)
Creatinine, Ser: 0.98 mg/dL (ref 0.61–1.24)
GFR calc Af Amer: >60 mL/min (ref >60)
GFR calc non Af Amer: >60 mL/min (ref >60)
GLUCOSE: 197 mg/dL — AB (ref 65–99)
POTASSIUM: 3.7 mmol/L (ref 3.5–5.1)
Sodium: 145 mmol/L (ref 135–145)
TOTAL PROTEIN: 4.5 g/dL — AB (ref 6.5–8.1)
Total Bilirubin: 5 mg/dL — ABNORMAL HIGH (ref 0.3–1.2)

## 2015-08-24 LAB — CBC
HCT: 23 % — ABNORMAL LOW (ref 39.0–52.0)
HCT: 25.4 % — ABNORMAL LOW (ref 39.0–52.0)
HEMATOCRIT: 22.7 % — AB (ref 39.0–52.0)
HEMATOCRIT: 23.5 % — AB (ref 39.0–52.0)
HEMOGLOBIN: 7.2 g/dL — AB (ref 13.0–17.0)
HEMOGLOBIN: 8 g/dL — AB (ref 13.0–17.0)
Hemoglobin: 7.3 g/dL — ABNORMAL LOW (ref 13.0–17.0)
Hemoglobin: 7.6 g/dL — ABNORMAL LOW (ref 13.0–17.0)
MCH: 27.4 pg (ref 26.0–34.0)
MCH: 27.5 pg (ref 26.0–34.0)
MCH: 27.5 pg (ref 26.0–34.0)
MCH: 28 pg (ref 26.0–34.0)
MCHC: 31.5 g/dL (ref 30.0–36.0)
MCHC: 31.7 g/dL (ref 30.0–36.0)
MCHC: 31.7 g/dL (ref 30.0–36.0)
MCHC: 32.3 g/dL (ref 30.0–36.0)
MCV: 86.6 fL (ref 78.0–100.0)
MCV: 86.7 fL (ref 78.0–100.0)
MCV: 86.8 fL (ref 78.0–100.0)
MCV: 87 fL (ref 78.0–100.0)
PLATELETS: 25 10*3/uL — AB (ref 150–400)
Platelets: 27 10*3/uL — CL (ref 150–400)
Platelets: 28 10*3/uL — CL (ref 150–400)
Platelets: 32 10*3/uL — ABNORMAL LOW (ref 150–400)
RBC: 2.62 MIL/uL — ABNORMAL LOW (ref 4.22–5.81)
RBC: 2.65 MIL/uL — ABNORMAL LOW (ref 4.22–5.81)
RBC: 2.71 MIL/uL — ABNORMAL LOW (ref 4.22–5.81)
RBC: 2.92 MIL/uL — ABNORMAL LOW (ref 4.22–5.81)
RDW: 16.5 % — AB (ref 11.5–15.5)
RDW: 16.8 % — AB (ref 11.5–15.5)
RDW: 16.9 % — ABNORMAL HIGH (ref 11.5–15.5)
RDW: 17.1 % — AB (ref 11.5–15.5)
WBC: 3.1 10*3/uL — AB (ref 4.0–10.5)
WBC: 3.2 10*3/uL — ABNORMAL LOW (ref 4.0–10.5)
WBC: 3.4 10*3/uL — AB (ref 4.0–10.5)
WBC: 3.8 10*3/uL — ABNORMAL LOW (ref 4.0–10.5)

## 2015-08-24 LAB — PROCALCITONIN: Procalcitonin: 1.15 ng/mL

## 2015-08-24 LAB — TYPE AND SCREEN
ABO/RH(D): O POS
Antibody Screen: NEGATIVE
UNIT DIVISION: 0
UNIT DIVISION: 0
Unit division: 0
Unit division: 0
Unit division: 0
Unit division: 0

## 2015-08-24 LAB — PREPARE PLATELET PHERESIS: Unit division: 0

## 2015-08-24 LAB — PREPARE FRESH FROZEN PLASMA
UNIT DIVISION: 0
Unit division: 0

## 2015-08-24 LAB — GLUCOSE, CAPILLARY
GLUCOSE-CAPILLARY: 148 mg/dL — AB (ref 65–99)
GLUCOSE-CAPILLARY: 175 mg/dL — AB (ref 65–99)
GLUCOSE-CAPILLARY: 180 mg/dL — AB (ref 65–99)
Glucose-Capillary: 155 mg/dL — ABNORMAL HIGH (ref 65–99)
Glucose-Capillary: 166 mg/dL — ABNORMAL HIGH (ref 65–99)
Glucose-Capillary: 195 mg/dL — ABNORMAL HIGH (ref 65–99)
Glucose-Capillary: 198 mg/dL — ABNORMAL HIGH (ref 65–99)

## 2015-08-24 LAB — PROTIME-INR
INR: 1.87
Prothrombin Time: 21.8 seconds — ABNORMAL HIGH (ref 11.4–15.2)

## 2015-08-24 LAB — HIV ANTIBODY (ROUTINE TESTING W REFLEX): HIV Screen 4th Generation wRfx: NONREACTIVE

## 2015-08-24 LAB — MAGNESIUM: Magnesium: 1.3 mg/dL — ABNORMAL LOW (ref 1.7–2.4)

## 2015-08-24 MED ORDER — VITAMIN K1 10 MG/ML IJ SOLN
10.0000 mg | Freq: Once | INTRAVENOUS | Status: AC
  Administered 2015-08-24: 10 mg via INTRAVENOUS
  Filled 2015-08-24: qty 1

## 2015-08-24 MED ORDER — INSULIN ASPART 100 UNIT/ML ~~LOC~~ SOLN
2.0000 [IU] | SUBCUTANEOUS | Status: DC
Start: 2015-08-24 — End: 2015-08-25
  Administered 2015-08-24 – 2015-08-25 (×5): 4 [IU] via SUBCUTANEOUS
  Administered 2015-08-25 (×2): 2 [IU] via SUBCUTANEOUS

## 2015-08-24 MED ORDER — MAGNESIUM SULFATE 2 GM/50ML IV SOLN
2.0000 g | Freq: Once | INTRAVENOUS | Status: AC
  Administered 2015-08-24: 2 g via INTRAVENOUS
  Filled 2015-08-24: qty 50

## 2015-08-24 NOTE — Progress Notes (Signed)
PULMONARY / CRITICAL CARE MEDICINE   Name: Bradley Harris MRN: [id number] DOB: 03/27/1955    ADMISSION DATE:  08/22/2015 CONSULTATION DATE:  08/22/15  REFERRING MD:  Bradley Harris  CHIEF COMPLAINT:  Upper GI bleed  HISTORY OF PRESENT ILLNESS:   Bradley Harris is a 60 year old with history of alcoholism, diabetes mellitus, depression, hyperlipidemia. He is admitted today with hematemesis and melena for the past 2 days. He drinks a  bottle of bourbon every day. In the ED he was found to have tachycardia, hypertension which improved after 2 L normal saline. He has H/O ascites that was tapped a few years ago. No known varices, he has never had a EGD.  ->He was initially admitted to [facility] and [facility]. However while getting his first unit of blood he stood up and became tachycardic to the 160s. He continues to have hematemesis and melena. PCCM called for admission due to hemodynamic instability.  SUBJECTIVE:  Pt did well overnight. Now following commands. Received 2 units pRBCs and 1 unit platelets. No signs of bleeding overnight.  VITAL SIGNS: BP 111/77   Pulse 90   Temp 99.1 F (37.3 C) (Oral)   Resp 16   Ht  (1.93 m)   Wt 112 kg (246 lb 14.6 oz)   SpO2 99%   BMI 30.06 kg/m   HEMODYNAMICS: CVP:  [6 mmHg-13 mmHg] 7 mmHg VENTILATOR SETTINGS: Vent Mode: PRVC FiO2 (%):  [40 %] 40 % Set Rate:  [14 bmp-24 bmp] 14 bmp Vt Set:  [700 mL] 700 mL PEEP:  [5 cmH20] 5 cmH20 Plateau Pressure:  [11 cmH20-17 cmH20] 16 cmH20 INTAKE / OUTPUT: I/O last 3 completed shifts: In: 10459.6 [P.O.:2030; I.V.:6127.6; Blood:1342; IV Piggyback:960] Out: 2165 [Urine:1890; Stool:275]  PHYSICAL EXAMINATION: General: Ill-appearing middle aged male Neuro: Awake, alert, follows commands HEENT:  Moist mucous membranes, ETT in place Cardiovascular:  RRR, no murmurs rubs gallops Lungs:  Clear bilaterally, no wheezing, crackles, comfortable work of breathing Abdomen:  Distended, soft, positive bowel sounds, no  guarding, rigidity Musculoskeletal:  Normal tone and bulk Skin:  Intact, no rash.  LABS:  BMET  Recent Labs Lab 08/22/15 1200 08/23/15 0259 08/24/15 0350  NA 145 143 145  K 5.7* 4.6 3.7  CL 111 107 110  CO2 14* 20* 29  BUN 17 25* 29*  CREATININE 0.99 1.60* 0.98  GLUCOSE 161* 201* 197*    Electrolytes  Recent Labs Lab 08/22/15 0742 08/22/15 1200 08/23/15 0259 08/23/15 0950 08/24/15 0350  CALCIUM 7.2* 7.2* 6.3*  --  6.7*  MG 1.5*  --   --  1.0*  --   PHOS 5.4*  --   --   --   --     CBC  Recent Labs Lab 08/23/15 0950 08/23/15 1600 08/24/15 0010  WBC 9.0 5.1 3.8*  HGB 7.0* 6.3* 7.3*  HCT 22.3* 20.3* 23.0*  PLT 43* 29* 28*    Coag's  Recent Labs Lab 08/22/15 0147 08/22/15 0742 08/23/15 0259  APTT  --  28  --   INR 1.36 1.80 2.11    Sepsis Markers  Recent Labs Lab 08/22/15 0742  08/23/15 0259 08/23/15 0940 08/23/15 1145 08/24/15 0350  LATICACIDVEN 9.6*  < > 8.1* 6.1* 4.2*  --   PROCALCITON 0.18  --  2.08  --   --  1.15  < > = values in this interval not displayed.  ABG  Recent Labs Lab 08/22/15 1828 08/22/15 1943 08/23/15 0328  PHART 7.145* 7.278* 7.470*  PCO2ART 33.5*  27.2* 28.1*  PO2ART 165.0* 177.0* 96.0    Liver Enzymes  Recent Labs Lab 08/22/15 0742 08/23/15 0259 08/24/15 0350  AST 763* 1,884* 594*  ALT 282* 531* 352*  ALKPHOS 84 64 47  BILITOT 2.6* 4.2* 5.0*  ALBUMIN 2.9* 2.5* 2.4*    Cardiac Enzymes  Recent Labs Lab 08/22/15 0742  TROPONINI <0.03    Glucose  Recent Labs Lab 08/23/15 0731 08/23/15 1148 08/23/15 1554 08/23/15 2022 08/24/15 0006 08/24/15 0346  GLUCAP 240* 235* 211* 200* 198* 195*    Imaging Dg Chest Port 1 View  Result Date: 08/23/2015 CLINICAL DATA:  Fluid overload. EXAM: PORTABLE CHEST 1 VIEW COMPARISON:  One-view chest 08/22/2015 FINDINGS: The heart size is exaggerated by low lung volumes. Endotracheal tube and right IJ line are stable. Aeration is slightly improved.  IMPRESSION: Improving aeration with persistent low lung volumes. Electronically Signed   By: Marin Roberts M.D.   On: 08/23/2015 12:28    STUDIES:  EGD 7/28 >> bleeding Mallory-Weiss tear (cauterized); esophageal varices (not actively bleeding)  CULTURES: Bcx 7/28 > GNR, GPC>>> Ucx 7/28> Sputum 7/28>  ANTIBIOTICS: Ceftriaxone 7/28 >  SIGNIFICANT EVENTS: 7/28 >>  Admitted to ICU for hemodynamic instability. 7/28 >> intubated to secure airway for EGD 7/29 >> off pressors  LINES/TUBES: PIV 7/28 >  ETT 7/28 > CVC R IJ 7/28 >  Foley 7/28 >   DISCUSSION: 60 year old with upper GI bleed, alcohol abuse.Pt has portal hypertension, ascites but no documented varices. H/O gastric bypass surgery. Now admitted w/ working dx of Upper GI bleed. His transfusion was initially held d/t tachycardia felt possibly from transfusion reaction (think more likely this is on-going bleeding). He is on Protonix & octreotide drips, Empiric coverage of SBP with ceftriaxone. He is now in shock since arrival to the ICU. We will resume transfusion, add levophed for MAP >65, place large bore CVL and intubate when BP improved to secure airway for EGD. He has declined significantly since admission. Also at risk for significant w/d given his daily ETOH consumption. Some of his tachycardia may also be explained by this.    ASSESSMENT / PLAN: PULMONARY A: Intubated for airway protection Aspiration risk P:   Full vent support. Weaning trial today. [facility]AD protocol  abg reviewed, may nee dTV redcution  CARDIOVASCULAR A:  Tachycardia> Likely related to GI bleed- resolved. 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 Cirrhosis given prior paracentesis in hx P:   Keep NPO Cont protonix and octreotide drips per GI. Consider stopping octreotide  If he rebleeds, will need embolization by IR or surgery On Zosyn, which will cover SBP  HEMATOLOGIC A:   Acute blood loss anemia 2/2 upper GI bleed- s/p 6 units pRBCs, 2 units FFP Thrombocytopenia- related to chronic alcohol use, dilution- s/p 1 unit platelets Mild coagulopathy (liver, mods, consumption)- s/p Vitamin K x 2 DVT prophylaxis P:  Plat if bleeding or less 20 k  Serial CBCs SCDs  Repeat vit k last day  INFECTIOUS A:   R/O SBP Blood  cultures growing GNRs and GPCs- biofire with Enterococcus, E. Coli, and Klebsiella- likely translocation associated with UGIB PCT 0.18 > 2.08 > 1.15 P:   Zosyn per ID Follow-up blood/urine cultures Hep panel, HIV in process Will [place] abdo, may tap  ENDOCRINE A:   Diabetes mellitus P:   Hold outpatient insulin while NPO Change to ICU hyperglycemia  NEUROLOGIC A:   Heavy h/o ETOH and at high risk for withdrawal High high high risk enceph hepatic P:   Monitor for alcohol withdrawal Thiamine, folic acid IV fent drip, Versed prn  Willadean Carol, MD Family Medicine, PGY-2  STAFF NOTE: I, Rory Percy, MD FACP have personally reviewed patient's available data, including medical history, events of note, physical examination and test results as part of my evaluation. I have discussed with resident/NP and other care providers such as pharmacist, RN and RRT. In addition, I personally evaluated patient and elicited key findings of: awake, follows commands well, inr and plat noted, no active bleeding noted overnight, ABg reviewed, may need reduce T v, wean aggressive cpap 5 ps 5, goal extubation in 30 in , assess  neuro status and rsbi, upright, even balance goals now, may need lasix soon, pcxr in am , would NOT tx plat unless bleeding or less 20 k also not responding to prior plat tx, inr not budging either from ffp, more products will only continued to dilute him without clinical evidence bleeding, dc octreotide at 48, continued ppi drip, cbc seriel to remain, npo, given multiple BC pos organisms, I will [place] abdo if sig ascites will tap The patient is critically ill with multiple organ systems failure and requires high complexity decision making for assessment and support, frequent evaluation and titration of therapies, application of advanced monitoring technologies and extensive interpretation of multiple databases.   Critical Care Time devoted to patient care services described in this note is 35 Minutes. This time reflects time of care of this signee: Rory Percy, MD FACP. This critical care time does not reflect procedure time, or teaching time or supervisory time of PA/NP/Med student/Med Resident etc but could involve care discussion time. Rest per NP/medical resident whose note is outlined above and that I agree with   Mcarthur Rossetti. Tyson Alias, MD, FACP Pgr: [phone] [facility] 08/24/2015 7:46 AM

## 2015-08-24 NOTE — Procedures (Signed)
Extubation Procedure Note  Patient Details:   Name: Bradley Harris DOB: October 18, 1955 MRN: [id number]   Airway Documentation:     Evaluation  O2 sats: stable throughout Complications: No apparent complications Patient did tolerate procedure well. Bilateral Breath Sounds: Clear, Diminished   Yes pt able to vocalize.   Pt extubated at this time without complications. Pt was able to breathe around deflated cuff. No stridor noted at this time. Pt has adequate cough.  Loyal Jacobson [facility] 08/24/2015, 9:00 AM

## 2015-08-24 NOTE — Progress Notes (Signed)
[facility] for Infectious Disease    Date of Admission:  08/22/2015   Total days of antibiotics 3        Day 3 piptazo           ID: Bradley Harris is a 60 y.o. male  with  alcoholic cirrhosis c/b ascites, UGIB from M-W tears, EV, and pHTN presents with sepsis due to UGIB s/p cauterization of vessels related to M-W tears/active bleeding. Found to have polymicrobial bacteremia Principal Problem:   Bleeding gastrointestinal Active Problems:   Alcoholism ([place])   Depression   Diabetes mellitus without complication (HCC)   Hypercholesterolemia   Neuropathy (HCC)   Elevated lactic acid level   Thrombocytopenia (HCC)   Hemorrhagic shock   Acute respiratory failure (HCC)   Portal hypertension with esophageal varices (HCC)   Alcohol intoxication ([place])   Elevated liver enzymes    Subjective: Over last 24hr received 2 addn units of RBC plus platelets. Successfully extubate this morning. Remains afebrile. No further evidence of hemoptysis.  ROS = no fevers, mild cough  Medications:  . antiseptic oral rinse  7 mL Mouth Rinse QID  . chlorhexidine gluconate (SAGE KIT)  15 mL Mouth Rinse BID  . folic acid  1 mg Intravenous Daily  . insulin aspart  2-6 Units Subcutaneous Q4H  . [START ON 08/25/2015] pantoprazole  40 mg Intravenous Q12H  . phytonadione (VITAMIN K) IV  10 mg Intravenous Once  . piperacillin-tazobactam (ZOSYN)  IV  3.375 g Intravenous Q8H  . sodium chloride flush  3 mL Intravenous Q12H  . thiamine  100 mg Oral Daily   Or  . thiamine  100 mg Intravenous Daily    Objective: Vital signs in last 24 hours: Temp:  [98.7 F (37.1 C)-100.2 F (37.9 C)] 99.1 F (37.3 C) (07/30 0341) Pulse Rate:  [84-127] 94 (07/30 0831) Resp:  [12-21] 21 (07/30 0831) BP: (79-136)/(52-89) 136/89 (07/30 0831) SpO2:  [93 %-100 %] 98 % (07/30 0831) FiO2 (%):  [40 %] 40 % (07/30 0751) Weight:  [112 kg (246 lb 14.6 oz)] 112 kg (246 lb 14.6 oz) (07/30 0456) Physical Exam    Constitutional: He is oriented to person, and place. He appears well-developed and well-nourished. No distress. jaundiced HENT:  Mouth/Throat: Oropharynx is clear and moist. No oropharyngeal exudate. Dry lips blood tinged Cardiovascular: Normal rate, regular rhythm and normal heart sounds. Exam reveals no gallop and no friction rub.  No murmur heard.  Pulmonary/Chest: Effort normal and breath sounds normal. No respiratory distress. He has no wheezes.  Abdominal: Soft. Bowel sounds are normal. mildly distension. There is no tenderness. UG= foley in place with blood tinged urine  Lymphadenopathy:  He has no cervical adenopathy.  Neurological: He is alert and oriented to person, place, and time.  Skin: Skin is warm and dry. No rash noted. No erythema.  Ext: + 1 lower extremity edema Psychiatric: He has a normal mood and affect. His behavior is normal.     Lab Results  Recent Labs  08/23/15 0259  08/24/15 0010 08/24/15 0350 08/24/15 0630  WBC 11.7*  < > 3.8*  --  3.4*  HGB 7.4*  < > 7.3*  --  7.2*  HCT 23.5*  < > 23.0*  --  22.7*  NA 143  --   --  145  --   K 4.6  --   --  3.7  --   CL 107  --   --  110  --  CO2 20*  --   --  29  --   BUN 25*  --   --  29*  --   CREATININE 1.60*  --   --  0.98  --   < > = values in this interval not displayed. Liver Panel  Recent Labs  08/23/15 0259 08/24/15 0350  PROT 4.7* 4.5*  ALBUMIN 2.5* 2.4*  AST 1,884* 594*  ALT 531* 352*  ALKPHOS 64 47  BILITOT 4.2* 5.0*    Microbiology: Blood cx 1 of 2 = polymicrobial with ecoli, kleb, enterococcus.  Studies/Results: Dg Chest Port 1 View  Result Date: 08/24/2015 CLINICAL DATA:  Fluid overload EXAM: PORTABLE CHEST 1 VIEW COMPARISON:  08/23/2015 FINDINGS: Endotracheal tube and right dialysis catheter are unchanged. Improving aeration. No confluent airspace opacities, effusions or edema. Heart is normal size. No acute bony abnormality. IMPRESSION: Stable support devices. No acute findings.  Electronically Signed   By: Rolm Baptise M.D.   On: 08/24/2015 08:15  Dg Chest Port 1 View  Result Date: 08/23/2015 CLINICAL DATA:  Fluid overload. EXAM: PORTABLE CHEST 1 VIEW COMPARISON:  One-view chest 08/22/2015 FINDINGS: The heart size is exaggerated by low lung volumes. Endotracheal tube and right IJ line are stable. Aeration is slightly improved. IMPRESSION: Improving aeration with persistent low lung volumes. Electronically Signed   By: San Morelle M.D.   On: 08/23/2015 12:28  Dg Chest Port 1 View  Result Date: 08/22/2015 CLINICAL DATA:  Status post intubation and central line placement today. EXAM: PORTABLE CHEST 1 VIEW COMPARISON:  Single-view of the chest earlier today. FINDINGS: Endotracheal tube is in place with the tip in good position 4 cm above the carina. Right IJ catheter tip projects in the lower superior vena cava. Negative for pneumothorax. Lungs are clear. Heart size is normal. No pleural effusion. IMPRESSION: ETT and right IJ catheter projecting good position. Negative for pneumothorax or acute disease. Electronically Signed   By: Inge Rise M.D.   On: 08/22/2015 11:31    Assessment/Plan: 60yo M with likely alcoholic cirrhosis c/b ascites, UGIB from M-W tears, EV, and pHTN presents with sepsis due to UGIB s/p cauterization of vessels related to M-W tears/active bleeding. Found to have polymicrobial bacteremia  Polymicrobial bacteremia = possibly due to translocation of bacteria in setting of GI bleed vs. Contaminate. Appears to be improving on broad spectrum. Currently on piptazo to cover pathogens. Leukocytosis improving in addn not to having any fevers. Pending response to abtx, may only need to treat for SBP  Leukocytosis = improved  Advanced cirrhosis = on admit appears to be c/w Child Pugh class B. He had increase in transaminitis due to likely hypotension, and alcohol insult. Appears trending down 500-300s. Hepatitis panel and hiv pending. Consider RUQ  and ascites eval  Recent UGIB = agree with keeping npo to watch for next 24hr risk for rebleed   [name] [facility] for Infectious Diseases Cell: [phone] Pager: [phone]  08/24/2015, 9:13 AM

## 2015-08-24 NOTE — Progress Notes (Signed)
Wasted 240 mL of fentanyl in the sink. Witnessed by [employer].

## 2015-08-24 NOTE — Progress Notes (Signed)
eLink Physician-Brief Progress Note Patient Name: Bradley Harris DOB: March 02, 1955 MRN: [id number]   Date of Service  08/24/2015  HPI/Events of Note  Low magnesium   eICU Interventions  replaced     Intervention Category Minor Interventions: Electrolytes abnormality - evaluation and management  Henry Russel, P 08/24/2015, 9:25 PM

## 2015-08-25 DIAGNOSIS — E1310 Other specified diabetes mellitus with ketoacidosis without coma: Secondary | ICD-10-CM

## 2015-08-25 DIAGNOSIS — R7881 Bacteremia: Secondary | ICD-10-CM

## 2015-08-25 DIAGNOSIS — K7030 Alcoholic cirrhosis of liver without ascites: Secondary | ICD-10-CM

## 2015-08-25 DIAGNOSIS — E1110 Type 2 diabetes mellitus with ketoacidosis without coma: Secondary | ICD-10-CM

## 2015-08-25 LAB — CULTURE, BLOOD (ROUTINE X 2)

## 2015-08-25 LAB — COMPREHENSIVE METABOLIC PANEL
ALBUMIN: 2.4 g/dL — AB (ref 3.5–5.0)
ALT: 314 U/L — ABNORMAL HIGH (ref 17–63)
ANION GAP: 8 (ref 5–15)
AST: 372 U/L — ABNORMAL HIGH (ref 15–41)
Alkaline Phosphatase: 48 U/L (ref 38–126)
BUN: 20 mg/dL (ref 6–20)
CALCIUM: 6.8 mg/dL — AB (ref 8.9–10.3)
CHLORIDE: 108 mmol/L (ref 101–111)
CO2: 28 mmol/L (ref 22–32)
Creatinine, Ser: 0.83 mg/dL (ref 0.61–1.24)
GFR calc non Af Amer: >60 mL/min (ref >60)
GLUCOSE: 163 mg/dL — AB (ref 65–99)
POTASSIUM: 3.3 mmol/L — AB (ref 3.5–5.1)
SODIUM: 144 mmol/L (ref 135–145)
Total Bilirubin: 5.1 mg/dL — ABNORMAL HIGH (ref 0.3–1.2)
Total Protein: 4.6 g/dL — ABNORMAL LOW (ref 6.5–8.1)

## 2015-08-25 LAB — GLUCOSE, CAPILLARY
GLUCOSE-CAPILLARY: 166 mg/dL — AB (ref 65–99)
GLUCOSE-CAPILLARY: 140 mg/dL — AB (ref 65–99)
Glucose-Capillary: 144 mg/dL — ABNORMAL HIGH (ref 65–99)
Glucose-Capillary: 251 mg/dL — ABNORMAL HIGH (ref 65–99)
Glucose-Capillary: 208 mg/dL — ABNORMAL HIGH (ref 65–99)

## 2015-08-25 LAB — CBC
HEMATOCRIT: 25 % — AB (ref 39.0–52.0)
Hemoglobin: 8.3 g/dL — ABNORMAL LOW (ref 13.0–17.0)
MCH: 28.3 pg (ref 26.0–34.0)
MCHC: 33.2 g/dL (ref 30.0–36.0)
MCV: 85.3 fL (ref 78.0–100.0)
PLATELETS: UNDETERMINED 10*3/uL (ref 150–400)
RBC: 2.93 MIL/uL — ABNORMAL LOW (ref 4.22–5.81)
RDW: 16.5 % — AB (ref 11.5–15.5)
WBC: 3.7 10*3/uL — AB (ref 4.0–10.5)

## 2015-08-25 LAB — HEPATITIS B SURFACE ANTIGEN: Hepatitis B Surface Ag: NEGATIVE

## 2015-08-25 LAB — HEPATITIS B SURFACE ANTIBODY,QUALITATIVE: Hep B S Ab: NONREACTIVE

## 2015-08-25 LAB — HEPATITIS C ANTIBODY: HCV AB: 0.1 {s_co_ratio} (ref 0.0–0.9)

## 2015-08-25 LAB — PROTIME-INR
INR: 1.77
PROTHROMBIN TIME: 20.8 s — AB (ref 11.4–15.2)

## 2015-08-25 LAB — MAGNESIUM: Magnesium: 1.7 mg/dL (ref 1.7–2.4)

## 2015-08-25 MED ORDER — POTASSIUM CHLORIDE 10 MEQ/50ML IV SOLN
10.0000 meq | INTRAVENOUS | Status: AC
  Administered 2015-08-25 (×3): 10 meq via INTRAVENOUS
  Filled 2015-08-25 (×3): qty 50

## 2015-08-25 MED ORDER — ALPRAZOLAM 0.5 MG PO TABS
0.5000 mg | ORAL_TABLET | Freq: Three times a day (TID) | ORAL | Status: DC | PRN
  Administered 2015-08-25 – 2015-08-29 (×7): 0.5 mg via ORAL
  Filled 2015-08-25 (×7): qty 1

## 2015-08-25 MED ORDER — INSULIN ASPART 100 UNIT/ML ~~LOC~~ SOLN
0.0000 [IU] | Freq: Every day | SUBCUTANEOUS | Status: DC
  Administered 2015-08-25 – 2015-08-28 (×2): 2 [IU] via SUBCUTANEOUS

## 2015-08-25 MED ORDER — DEXTROSE 5 % IV SOLN
2.0000 g | INTRAVENOUS | Status: DC
  Administered 2015-08-25: 2 g via INTRAVENOUS
  Filled 2015-08-25 (×2): qty 2

## 2015-08-25 MED ORDER — CITALOPRAM HYDROBROMIDE 20 MG PO TABS
20.0000 mg | ORAL_TABLET | Freq: Every day | ORAL | Status: DC
  Administered 2015-08-25 – 2015-08-29 (×5): 20 mg via ORAL
  Filled 2015-08-25 (×5): qty 1

## 2015-08-25 MED ORDER — FOLIC ACID 1 MG PO TABS
1.0000 mg | ORAL_TABLET | Freq: Every day | ORAL | Status: DC
  Administered 2015-08-25 – 2015-08-29 (×5): 1 mg via ORAL
  Filled 2015-08-25 (×5): qty 1

## 2015-08-25 MED ORDER — INSULIN ASPART 100 UNIT/ML ~~LOC~~ SOLN: 2.0000 [IU] | SUBCUTANEOUS | Status: DC

## 2015-08-25 MED ORDER — BUSPIRONE HCL 5 MG PO TABS
5.0000 mg | ORAL_TABLET | Freq: Two times a day (BID) | ORAL | Status: DC
  Administered 2015-08-25 – 2015-08-29 (×8): 5 mg via ORAL
  Filled 2015-08-25 (×9): qty 1

## 2015-08-25 MED ORDER — INSULIN ASPART 100 UNIT/ML ~~LOC~~ SOLN
0.0000 [IU] | Freq: Three times a day (TID) | SUBCUTANEOUS | Status: DC
  Administered 2015-08-25: 1 [IU] via SUBCUTANEOUS
  Administered 2015-08-25: 5 [IU] via SUBCUTANEOUS
  Administered 2015-08-26 (×2): 3 [IU] via SUBCUTANEOUS
  Administered 2015-08-27 – 2015-08-29 (×8): 2 [IU] via SUBCUTANEOUS

## 2015-08-25 NOTE — Progress Notes (Signed)
Pt's urinary catheter discontinued at 1130; pt has th urge to void but unable to do so despite dangling at bedside and attempting to stand; bladder scanned with difficult due to lage amount of loose scan; app 100 ml noted per scan;attempted  to I&O catheterize but  Patient asked for more time; pt given a cup of water per request; will continue to monitor patient.  Burnard Bunting, RN

## 2015-08-25 NOTE — Progress Notes (Signed)
eLink Physician-Brief Progress Note Patient Name: Bradley Harris DOB: [birth year] MRN: [id number]   Date of Service  08/25/2015  HPI/Events of Note  Requesting home meds for depression/anxiety and prn anxiety meds  eICU Interventions  Buspar, citalopram restarted Start prn xanax     Intervention Category Minor Interventions: Agitation / anxiety - evaluation and management  Max Fickle 08/25/2015, 7:58 PM

## 2015-08-25 NOTE — Progress Notes (Signed)
08/25/2015 Patient transfer from 46m to 2c at 1825. He is alert, oriented and weak. Patient stated he has been falling and he is a fall risk. Yellow arm band was place on patient risk and he have yellow socks, also bed alarm was activated.  Dressing right neck had CVC line and it was removed. Patient have skin tear on bilateral arms, bruises on back and side, lower legs and scabs on toes bilaterally. Patient sacrum is red and a blister on right buttock. Heels are dry. Under foam dressing was assess no wound noted. CHG bath was given and patient was placed on telemetry. Lovie Macadamia RN

## 2015-08-25 NOTE — Progress Notes (Signed)
3 Days Post-Op  Subjective: Patient tolerating full liquids well. Has not ambulated from bed yet. Has not had BM for past "several days". Passing flatus. Denies nausea and vomiting. Tachycardic.  Objective: Vital signs in last 24 hours: Temp:  [97.8 F (36.6 C)-98.7 F (37.1 C)] 98.6 F (37 C) (07/31 1114) Pulse Rate:  [74-91] 91 (07/30 1800) Resp:  [7-18] 12 (07/30 2000) BP: (116-132)/(77-83) 126/79 (07/30 1800) SpO2:  [96 %-100 %] 100 % (07/30 1800) Weight:  [249 lb 12.5 oz (113.3 kg)] 249 lb 12.5 oz (113.3 kg) (07/31 0430) Last BM Date: 08/23/15  Intake/Output from previous day: 07/30 0701 - 07/31 0700 In: 1266.4 [P.O.:360; I.V.:768.9; IV Piggyback:137.5] Out: 1250 [Urine:1250] Intake/Output this shift: Total I/O In: 185 [I.V.:85; IV Piggyback:100] Out: 650 [Urine:650]  General appearance  Supine. Awake and talkative.  Head: Normocephalic/atraumatic. PERRL bilaterally. Extraocular eye muscles in tact.  Neck: FROM Cardiovascular: Regular rate and rhythm without gallops, murmurs or rubs Respiratory: Lung sounds vesicular. No wheezes, crackles or rhonchi auscultated Abdomen: Soft and non tender without guarding, masses, or rigidity. +BS    Lab Results:   Recent Labs  08/24/15 2210 08/25/15 0440  WBC 3.1* 2.8*  HGB 7.6* 7.6*  HCT 23.5* 24.4*  PLT 25* 29*   BMET  Recent Labs  08/24/15 0350 08/25/15 0440  NA 145 144  K 3.7 3.3*  CL 110 108  CO2 29 28  GLUCOSE 197* 163*  BUN 29* 20  CREATININE 0.98 0.83  CALCIUM 6.7* 6.8*   PT/INR  Recent Labs  08/24/15 1746 08/25/15 0440  LABPROT 21.8* 20.8*  INR 1.87 1.77   ABG  Recent Labs  08/22/15 1943 08/23/15 0328  PHART 7.278* 7.470*  HCO3 12.6* 20.3    Studies/Results: Dg Chest Port 1 View  Result Date: 08/24/2015 CLINICAL DATA:  Fluid overload EXAM: PORTABLE CHEST 1 VIEW COMPARISON:  08/23/2015 FINDINGS: Endotracheal tube and right dialysis catheter are unchanged. Improving aeration. No  confluent airspace opacities, effusions or edema. Heart is normal size. No acute bony abnormality. IMPRESSION: Stable support devices. No acute findings. Electronically Signed   By: Charlett Nose M.D.   On: 08/24/2015 08:15   Anti-infectives: Anti-infectives    Start     Dose/Rate Route Frequency Ordered Stop   08/25/15 1000  cefTRIAXone (ROCEPHIN) 2 g in dextrose 5 % 50 mL IVPB     2 g 100 mL/hr over 30 Minutes Intravenous Every 24 hours 08/25/15 0914     08/23/15 2200  cefTRIAXone (ROCEPHIN) 2 g in dextrose 5 % 50 mL IVPB  Status:  Discontinued     2 g 100 mL/hr over 30 Minutes Intravenous Every 24 hours 08/22/15 0658 08/23/15 1407   08/23/15 2130  piperacillin-tazobactam (ZOSYN) IVPB 3.375 g  Status:  Discontinued     3.375 g 12.5 mL/hr over 240 Minutes Intravenous Every 8 hours 08/23/15 1448 08/25/15 0914   08/23/15 1530  piperacillin-tazobactam (ZOSYN) IVPB 3.375 g     3.375 g 100 mL/hr over 30 Minutes Intravenous  Once 08/23/15 1448 08/23/15 1638   08/23/15 0800  vancomycin (VANCOCIN) IVPB 1000 mg/200 mL premix  Status:  Discontinued     1,000 mg 200 mL/hr over 60 Minutes Intravenous Every 8 hours 08/22/15 2317 08/23/15 1407   08/22/15 2330  cefTRIAXone (ROCEPHIN) 2 g in dextrose 5 % 50 mL IVPB     2 g 100 mL/hr over 30 Minutes Intravenous STAT 08/22/15 2317 08/23/15 0040   08/22/15 2330  vancomycin (VANCOCIN) 2,000 mg in  sodium chloride 0.9 % 500 mL IVPB     2,000 mg 250 mL/hr over 120 Minutes Intravenous  Once 08/22/15 2317 08/23/15 0230   08/22/15 0700  cefTRIAXone (ROCEPHIN) 2 g in dextrose 5 % 50 mL IVPB  Status:  Discontinued     2 g 100 mL/hr over 30 Minutes Intravenous NOW 08/22/15 0658 08/22/15 1841      Assessment/Plan: [place] Artist Tear  Gastrointestinal Bleed Treating non surgically due to patient's high mortality risk. Radioembolization considered for successive bleeding episodes. Added Miralax PRN for constipation.   VTE: SCDs ID: On ceftriaxone, WBC  2.8 FEN: Full liquids  Dispo: Mobilization as tolerated. Continue octreotide, PPI and intensive care support.    Orvil Feil PASII  08/25/2015

## 2015-08-25 NOTE — Progress Notes (Signed)
PULMONARY / CRITICAL CARE MEDICINE   Name: Bradley Harris MRN: [id number] DOB: [birth year]    ADMISSION DATE:  08/22/2015 CONSULTATION DATE:  08/22/15  REFERRING MD:  Lorin Picket  CHIEF COMPLAINT:  Upper GI bleed  HISTORY OF PRESENT ILLNESS:   Bradley Harris is a 60 year old with history of alcoholism, diabetes mellitus, depression, hyperlipidemia. He is admitted today with hematemesis and melena for the past 2 days. He drinks a  bottle of bourbon every day. In the ED he was found to have tachycardia, hypertension which improved after 2 L normal saline. He has H/O ascites that was tapped a few years ago. No known varices, he has never had a EGD.  ->He was initially admitted to [facility] and SDU. However while getting his first unit of blood he stood up and became tachycardic to the 160s. He continues to have hematemesis and melena. PCCM called for admission due to hemodynamic instability.  SUBJECTIVE:  Extubated yesterday and has done well. No events overnight.  VITAL SIGNS: BP 126/79   Pulse 91   Temp 98.7 F (37.1 C) (Oral)   Resp 12   Ht 6\' 4"  (1.93 m)   Wt 113.3 kg (249 lb 12.5 oz)   SpO2 100%   BMI 30.40 kg/m   HEMODYNAMICS: CVP:  [5 mmHg-8 mmHg] 8 mmHg VENTILATOR SETTINGS: Vent Mode: CPAP;PSV FiO2 (%):  [40 %] 40 % PEEP:  [5 cmH20] 5 cmH20 Pressure Support:  [5 cmH20] 5 cmH20 INTAKE / OUTPUT: I/O last 3 completed shifts: In: 5148.8 [I.V.:2590.5; Blood:2158.3; IV Piggyback:400] Out: 2255 [Urine:2255]  PHYSICAL EXAMINATION: General: Tired-appearing middle-aged male Neuro: Awake, alert, follows commands, answers questions appropriately HEENT:  Moist mucous membranes, EOMI, [place] in place Cardiovascular:  RRR, no murmurs, rubs, gallops Lungs:  Clear bilaterally, no wheezing, crackles, normal work of breathing Abdomen:  Distended, soft, positive bowel sounds, no guarding, rigidity Musculoskeletal:  Normal tone and bulk Skin:  Intact, no rash.  LABS:  BMET  Recent  Labs Lab 08/22/15 1200 08/23/15 0259 08/24/15 0350  NA 145 143 145  K 5.7* 4.6 3.7  CL 111 107 110  CO2 14* 20* 29  BUN 17 25* 29*  CREATININE 0.99 1.60* 0.98  GLUCOSE 161* 201* 197*    Electrolytes  Recent Labs Lab 08/22/15 0742 08/22/15 1200 08/23/15 0259 08/23/15 0950 08/24/15 0350 08/24/15 1746  CALCIUM 7.2* 7.2* 6.3*  --  6.7*  --   MG 1.5*  --   --  1.0*  --  1.3*  PHOS 5.4*  --   --   --   --   --     CBC  Recent Labs Lab 08/24/15 1455 08/24/15 2210 08/25/15 0440  WBC 3.2* 3.1* 2.8*  HGB 8.0* 7.6* 7.6*  HCT 25.4* 23.5* 24.4*  PLT 32* 25* 29*    Coag's  Recent Labs Lab 08/22/15 0742 08/23/15 0259 08/24/15 1746  APTT 28  --   --   INR 1.80 2.11 1.87    Sepsis Markers  Recent Labs Lab 08/22/15 0742  08/23/15 0259 08/23/15 0940 08/23/15 1145 08/24/15 0350  LATICACIDVEN 9.6*  < > 8.1* 6.1* 4.2*  --   PROCALCITON 0.18  --  2.08  --   --  1.15  < > = values in this interval not displayed.  ABG  Recent Labs Lab 08/22/15 1828 08/22/15 1943 08/23/15 0328  PHART 7.145* 7.278* 7.470*  PCO2ART 33.5* 27.2* 28.1*  PO2ART 165.0* 177.0* 96.0    Liver Enzymes  Recent Labs Lab 08/22/15  4098 08/23/15 0259 08/24/15 0350  AST 763* 1,884* 594*  ALT 282* 531* 352*  ALKPHOS 84 64 47  BILITOT 2.6* 4.2* 5.0*  ALBUMIN 2.9* 2.5* 2.4*    Cardiac Enzymes  Recent Labs Lab 08/22/15 0742  TROPONINI <0.03    Glucose  Recent Labs Lab 08/24/15 0740 08/24/15 1150 08/24/15 1532 08/24/15 2004 08/24/15 2341 08/25/15 0423  GLUCAP 175* 180* 166* 155* 148* 166*    Imaging Dg Chest Port 1 View  Result Date: 08/24/2015 CLINICAL DATA:  Fluid overload EXAM: PORTABLE CHEST 1 VIEW COMPARISON:  08/23/2015 FINDINGS: Endotracheal tube and right dialysis catheter are unchanged. Improving aeration. No confluent airspace opacities, effusions or edema. Heart is normal size. No acute bony abnormality. IMPRESSION: Stable support devices. No acute  findings. Electronically Signed   By: Charlett Nose M.D.   On: 08/24/2015 08:15    STUDIES:  EGD 7/28 >> bleeding Mallory-Weiss tear (cauterized); esophageal varices (not actively bleeding)  CULTURES: Bcx 7/28 > E. Coli, Klebsiella, Enterococcus Ucx 7/28>  ANTIBIOTICS: Ceftriaxone 7/28 >7/29 Zosyn 7/29 >   SIGNIFICANT EVENTS: 7/28 >>  Admitted to ICU for hemodynamic instability. 7/28 >> intubated to secure airway for EGD 7/29 >> off pressors 7/30 >> Extubated  LINES/TUBES: PIV 7/28 >  ETT 7/28 > 7/30 CVC R IJ 7/28 >  Foley 7/28 >   DISCUSSION: 60 year old with upper GI bleed, alcohol abuse.Pt has portal hypertension, ascites but no documented varices. H/O gastric bypass surgery. Now admitted w/ working dx of Upper GI bleed. His transfusion was initially held d/t tachycardia felt possibly from transfusion reaction (think more likely this is on-going bleeding). He is on Protonix & octreotide drips, Empiric coverage of SBP with ceftriaxone. He is now in shock since arrival to the ICU. We will resume transfusion, add levophed for MAP >65, place large bore CVL and intubate when BP improved to secure airway for EGD. He has declined significantly since admission. Also at risk for significant w/d given his daily ETOH consumption. Some of his tachycardia may also be explained by this.    ASSESSMENT / PLAN: PULMONARY A: Intubated for airway protection, now extubated to [place] Aspiration risk P:   Wean O2 as tolerated to keep sats > 92%  CARDIOVASCULAR A:  Tachycardia> Likely related to GI bleed- resolved. Hypovolemic/hemorrhagic shock- s/p 6 units pRBC; off pressors 7/29 P:  Monitor for fluid overload Goal even to negative fluid balance Remove CVC R IJ  RENAL A:   Lactic acidosis in setting of GIB and organ hypoperfusion- resolved. Hyperkalemia- resolved Hypokalemia- replacing Hypomagnesemia - resolved Acute renal failure- resolved P:   Monitor urine output and Cr Daily  electrolytes Replace as needed KVO fluids Remove Foley  GASTROINTESTINAL A:   Upper GI bleed 2/2 mallory-weiss tear Alcohol abuse H/O gastric bypass Shock liver- improving H/o ascites  ? Cirrhosis given prior paracentesis in hx P:   Advance diet as tolerated, currently ice chips Switch Protonix gtt to bid If he rebleeds, will need embolization by IR or surgery On Zosyn, which will cover SBP  HEMATOLOGIC A:   Acute blood loss anemia 2/2 upper GI bleed- s/p 6 units pRBCs, 2 units FFP Thrombocytopenia- related to chronic alcohol use, dilution- s/p 1 unit platelets Mild coagulopathy (liver, mods, consumption)- s/p Vitamin K x 3 DVT prophylaxis P:  Will give platelets if actively bleeding or Plt < 20 Decrease CBCs from q8hrs to bid SCDs  Daily PT/INR  INFECTIOUS A:   R/O SBP Blood cultures growing Enterococcus, E. Coli,  Klebsiella PCT 0.18 > 2.08 > 1.15 HIV negative P:   Zosyn per ID Follow-up blood/urine culture susceptibilities  Consider abdominal US  ENDOCRINE A:   Diabetes mellitus- sugars controlled P:   CBG monitoring SSI  NEUROLOGIC A:   Heavy h/o ETOH and at high risk for withdrawal High high high risk enceph hepatic P:   Monitor for alcohol withdrawal Thiamine, folic acid IV  DISPO: - Transfer out to stepdown today   Bradley Carol, MD Family Medicine, PGY-2  Attending Note:  60 year old male with etoh history who presents to the hospital with hemorrhagic shock due to mallory weis tear that was cauterized by GI.  On exam, patient is stable, lungs clear to auscultation.  I reviewed the CXR myself, no acute disease.  Discussed with FPTS resident and bedside RN.  Hemorrhagic Shock:  - Monitor tele and BP.  - KVO IVF.  - D/C pressor.   Etoh abuse:  - Thiamine/folate/MVI.  - Switch to PO.  DM:  - ISS to AC/HS.  - Full liquid diet.  Bacteremia:  - Change zosyn to rocephin.  - Maintain on abx for now.  - ID following, appreciate  input.  Transfer to SDU and to [facility] service with PCCM off 8/1.  Patient seen and examined, agree with above note.  I dictated the care and orders written for this patient under my direction.  Bradley Reedy, MD [phone]

## 2015-08-25 NOTE — Progress Notes (Addendum)
[facility]   Reason for visit: Follow up on UGI bleed with polymicrobial bacteremia  Interval History: no new complaints, afebrile, leukopenia  Physical Exam: Constitutional:  Vitals:   08/25/15 0420 08/25/15 0718  BP:    Pulse:    Resp:    Temp: 98.7 F (37.1 C) 97.9 F (36.6 C)   patient appears in NAD Respiratory: Normal respiratory effort; CTA B Cardiovascular: RRR GI: soft, nt, mild distention  Review of Systems: Constitutional: negative for fevers, chills and malaise Gastrointestinal: negative for nausea and diarrhea  Lab Results  Component Value Date   WBC 2.8 (L) 08/25/2015   HGB 7.6 (L) 08/25/2015   HCT 24.4 (L) 08/25/2015   MCV 88.1 08/25/2015   PLT 29 (LL) 08/25/2015    Lab Results  Component Value Date   CREATININE 0.83 08/25/2015   BUN 20 08/25/2015   NA 144 08/25/2015   K 3.3 (L) 08/25/2015   CL 108 08/25/2015   CO2 28 08/25/2015    Lab Results  Component Value Date   ALT 314 (H) 08/25/2015   AST 372 (H) 08/25/2015   ALKPHOS 48 08/25/2015     Microbiology: Recent Results (from the past 240 hour(s))  Culture, blood (routine x 2)     Status: Abnormal (Preliminary result)   Collection Time: 08/22/15  7:45 AM  Result Value Ref Range Status   Specimen Description BLOOD LEFT ANTECUBITAL  Final   Special Requests BOTTLES DRAWN AEROBIC AND ANAEROBIC 5CC  Final   Culture  Setup Time   Final    GRAM NEGATIVE RODS GRAM POSITIVE COCCI IN PAIRS AND CHAINS IN BOTH AEROBIC AND ANAEROBIC BOTTLES Organism ID to follow CRITICAL RESULT CALLED TO, READ BACK BY AND VERIFIED WITH: V BRYK PHARMD 2254 08/22/15 A BROWNING    Culture (A)  Final    ESCHERICHIA COLI KLEBSIELLA PNEUMONIAE ENTEROCOCCUS SPECIES SUSCEPTIBILITIES TO FOLLOW    Report Status PENDING  Incomplete  Blood Culture ID Panel (Reflexed)     Status: Abnormal   Collection Time: 08/22/15  7:45 AM  Result Value Ref Range Status   Enterococcus species DETECTED  (A) NOT DETECTED Final    Comment: CRITICAL RESULT CALLED TO, READ BACK BY AND VERIFIED WITH: V BRYK PHARMD 2254 08/22/15 A BROWNING    Vancomycin resistance NOT DETECTED NOT DETECTED Final   Listeria monocytogenes NOT DETECTED NOT DETECTED Final   Staphylococcus species NOT DETECTED NOT DETECTED Final   Staphylococcus aureus NOT DETECTED NOT DETECTED Final   Methicillin resistance NOT DETECTED NOT DETECTED Final   Streptococcus species NOT DETECTED NOT DETECTED Final   Streptococcus agalactiae NOT DETECTED NOT DETECTED Final   Streptococcus pneumoniae NOT DETECTED NOT DETECTED Final   Streptococcus pyogenes NOT DETECTED NOT DETECTED Final   Acinetobacter baumannii NOT DETECTED NOT DETECTED Final   Enterobacteriaceae species DETECTED (A) NOT DETECTED Final    Comment: CRITICAL RESULT CALLED TO, READ BACK BY AND VERIFIED WITH: V BRYK PHARMD 2254 08/22/15 A BROWNING    Enterobacter cloacae complex NOT DETECTED NOT DETECTED Final   Escherichia coli DETECTED (A) NOT DETECTED Final    Comment: CRITICAL RESULT CALLED TO, READ BACK BY AND VERIFIED WITH: V BRYK PHARMD 2254 08/22/15 A BROWNING    Klebsiella oxytoca NOT DETECTED NOT DETECTED Final   Klebsiella pneumoniae DETECTED (A) NOT DETECTED Final    Comment: CRITICAL RESULT CALLED TO, READ BACK BY AND VERIFIED WITH: V BRYK PHARMD 2254 08/22/15 A BROWNING    Proteus  species NOT DETECTED NOT DETECTED Final   Serratia marcescens NOT DETECTED NOT DETECTED Final   Carbapenem resistance NOT DETECTED NOT DETECTED Final   Haemophilus influenzae NOT DETECTED NOT DETECTED Final   Neisseria meningitidis NOT DETECTED NOT DETECTED Final   Pseudomonas aeruginosa NOT DETECTED NOT DETECTED Final   Candida albicans NOT DETECTED NOT DETECTED Final   Candida glabrata NOT DETECTED NOT DETECTED Final   Candida krusei NOT DETECTED NOT DETECTED Final   Candida parapsilosis NOT DETECTED NOT DETECTED Final   Candida tropicalis NOT DETECTED NOT DETECTED Final    Culture, blood (routine x 2)     Status: None (Preliminary result)   Collection Time: 08/22/15  7:53 AM  Result Value Ref Range Status   Specimen Description BLOOD LEFT HAND  Final   Special Requests BOTTLES DRAWN AEROBIC ONLY 8CC  Final   Culture NO GROWTH 2 DAYS  Final   Report Status PENDING  Incomplete  MRSA PCR Screening     Status: None   Collection Time: 08/22/15  1:20 PM  Result Value Ref Range Status   MRSA by PCR NEGATIVE NEGATIVE Final    Comment:        The GeneXpert MRSA Assay (FDA approved for NASAL specimens only), is one component of a comprehensive MRSA colonization surveillance program. It is not intended to diagnose MRSA infection nor to guide or monitor treatment for MRSA infections.     Impression/Plan:  1. Bacteremia - likely transient translocation.  On zosyn.  No Pseudomonas so will narrow to ceftriaxone.  Will do a short course of antibiotics.   2.  UGI bleed - stable now.   3.  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

## 2015-08-26 ENCOUNTER — Inpatient Hospital Stay (HOSPITAL_COMMUNITY): Payer: Self-pay

## 2015-08-26 DIAGNOSIS — A498 Other bacterial infections of unspecified site: Secondary | ICD-10-CM

## 2015-08-26 DIAGNOSIS — Z9884 Bariatric surgery status: Secondary | ICD-10-CM

## 2015-08-26 DIAGNOSIS — K226 Gastro-esophageal laceration-hemorrhage syndrome: Principal | ICD-10-CM

## 2015-08-26 DIAGNOSIS — I8501 Esophageal varices with bleeding: Secondary | ICD-10-CM

## 2015-08-26 DIAGNOSIS — R7881 Bacteremia: Secondary | ICD-10-CM

## 2015-08-26 DIAGNOSIS — A499 Bacterial infection, unspecified: Secondary | ICD-10-CM

## 2015-08-26 LAB — COMPREHENSIVE METABOLIC PANEL
ALK PHOS: 52 U/L (ref 38–126)
ALT: 256 U/L — AB (ref 17–63)
AST: 203 U/L — AB (ref 15–41)
Albumin: 2.5 g/dL — ABNORMAL LOW (ref 3.5–5.0)
Anion gap: 5 (ref 5–15)
BUN: 8 mg/dL (ref 6–20)
CALCIUM: 7.6 mg/dL — AB (ref 8.9–10.3)
CHLORIDE: 103 mmol/L (ref 101–111)
CO2: 31 mmol/L (ref 22–32)
CREATININE: 0.72 mg/dL (ref 0.61–1.24)
GFR calc non Af Amer: >60 mL/min (ref >60)
Glucose, Bld: 224 mg/dL — ABNORMAL HIGH (ref 65–99)
Potassium: 3.6 mmol/L (ref 3.5–5.1)
SODIUM: 139 mmol/L (ref 135–145)
Total Bilirubin: 5 mg/dL — ABNORMAL HIGH (ref 0.3–1.2)
Total Protein: 5 g/dL — ABNORMAL LOW (ref 6.5–8.1)

## 2015-08-26 LAB — CBC
HCT: 26.1 % — ABNORMAL LOW (ref 39.0–52.0)
HEMATOCRIT: 24.4 % — AB (ref 39.0–52.0)
HEMATOCRIT: 24.4 % — AB (ref 39.0–52.0)
HEMOGLOBIN: 8.3 g/dL — AB (ref 13.0–17.0)
HEMOGLOBIN: 8 g/dL — AB (ref 13.0–17.0)
Hemoglobin: 7.6 g/dL — ABNORMAL LOW (ref 13.0–17.0)
MCH: 27.4 pg (ref 26.0–34.0)
MCH: 27.8 pg (ref 26.0–34.0)
MCH: 28.6 pg (ref 26.0–34.0)
MCHC: 31.1 g/dL (ref 30.0–36.0)
MCHC: 31.8 g/dL (ref 30.0–36.0)
MCHC: 32.8 g/dL (ref 30.0–36.0)
MCV: 88.1 fL (ref 78.0–100.0)
MCV: 87.3 fL (ref 78.0–100.0)
MCV: 87.1 fL (ref 78.0–100.0)
PLATELETS: 29 10*3/uL — AB (ref 150–400)
PLATELETS: 34 10*3/uL — AB (ref 150–400)
Platelets: 39 10*3/uL — ABNORMAL LOW (ref 150–400)
RBC: 2.77 MIL/uL — ABNORMAL LOW (ref 4.22–5.81)
RBC: 2.99 MIL/uL — AB (ref 4.22–5.81)
RBC: 2.8 MIL/uL — AB (ref 4.22–5.81)
RDW: 16.8 % — AB (ref 11.5–15.5)
RDW: 16.3 % — ABNORMAL HIGH (ref 11.5–15.5)
RDW: 16.9 % — ABNORMAL HIGH (ref 11.5–15.5)
WBC: 2.8 10*3/uL — AB (ref 4.0–10.5)
WBC: 2.2 10*3/uL — AB (ref 4.0–10.5)
WBC: 1 10*3/uL — AB (ref 4.0–10.5)

## 2015-08-26 LAB — GLUCOSE, CAPILLARY
GLUCOSE-CAPILLARY: 239 mg/dL — AB (ref 65–99)
GLUCOSE-CAPILLARY: 193 mg/dL — AB (ref 65–99)
Glucose-Capillary: 203 mg/dL — ABNORMAL HIGH (ref 65–99)
Glucose-Capillary: 247 mg/dL — ABNORMAL HIGH (ref 65–99)

## 2015-08-26 LAB — DIFFERENTIAL
BASOS ABS: 0 10*3/uL (ref 0.0–0.1)
Basophils Relative: 1 %
EOS PCT: 1 %
Eosinophils Absolute: 0 10*3/uL (ref 0.0–0.7)
LYMPHS ABS: 0.6 10*3/uL — AB (ref 0.7–4.0)
LYMPHS PCT: 27 %
Monocytes Absolute: 0.5 10*3/uL (ref 0.1–1.0)
Monocytes Relative: 22 %
NEUTROS ABS: 1 10*3/uL — AB (ref 1.7–7.7)
NEUTROS PCT: 49 %

## 2015-08-26 LAB — URINE CULTURE: CULTURE: NO GROWTH

## 2015-08-26 LAB — PROTIME-INR
INR: 1.64
Prothrombin Time: 19.6 seconds — ABNORMAL HIGH (ref 11.4–15.2)

## 2015-08-26 MED ORDER — IOPAMIDOL (ISOVUE-300) INJECTION 61%
INTRAVENOUS | Status: AC
  Administered 2015-08-26: 100 mL
  Filled 2015-08-26: qty 100

## 2015-08-26 MED ORDER — DIATRIZOATE MEGLUMINE & SODIUM 66-10 % PO SOLN
ORAL | Status: AC
  Filled 2015-08-26: qty 30

## 2015-08-26 MED ORDER — SODIUM CHLORIDE 0.9 % IV SOLN
3.0000 g | Freq: Four times a day (QID) | INTRAVENOUS | Status: DC
  Administered 2015-08-26 – 2015-08-29 (×15): 3 g via INTRAVENOUS
  Filled 2015-08-26 (×18): qty 3

## 2015-08-26 NOTE — Progress Notes (Signed)
Inpatient Diabetes Program Recommendations  AACE/ADA: New Consensus Statement on Inpatient Glycemic Control (2015)  Target Ranges:  Prepandial:   less than 140 mg/dL      Peak postprandial:   less than 180 mg/dL (1-2 hours)      Critically ill patients:  140 - 180 mg/dL   Lab Results  Component Value Date   GLUCAP 203 (H) 08/26/2015   HGBA1C 7.2 (H) 08/22/2015    Review of Glycemic Control  Results for CHASEN, ERTL (MRN [id number]) as of 08/26/2015 09:01  Ref. Range 08/25/2015 07:16 08/25/2015 11:13 08/25/2015 18:39 08/25/2015 22:07 08/26/2015 07:50  Glucose-Capillary Latest Ref Range: 65 - 99 mg/dL 233 (H) 007 (H) 622 (H) 208 (H) 203 (H)    Diabetes history: Type 2 Outpatient Diabetes medications: Novolog 1-10 units, Novolog mix 70/30 16 units qam, 10 units qpm Current orders for Inpatient glycemic control: Novolog 0-9 units tid, Novolog 0-5 units qhs  Inpatient Diabetes Program Recommendations:  Consider starting patient on 16 units Lantus starting now (patient currently uses 19.5 units of basal insulin per day)  Consider adding Novolog 2 units tid with meals, continue Novolog correction as ordered.   Susette Racer, RN, BA, MHA, CDE Diabetes Coordinator Inpatient Diabetes Program  [phone] (Team Pager) [phone] [facility] Office) 08/26/2015 9:08 AM

## 2015-08-26 NOTE — Progress Notes (Signed)
Subjective: No new complaints   Antibiotics:  Anti-infectives    Start     Dose/Rate Route Frequency Ordered Stop   08/26/15 0030  Ampicillin-Sulbactam (UNASYN) 3 g in sodium chloride 0.9 % 100 mL IVPB     3 g 100 mL/hr over 60 Minutes Intravenous Every 6 hours 08/26/15 0016     08/25/15 1000  cefTRIAXone (ROCEPHIN) 2 g in dextrose 5 % 50 mL IVPB  Status:  Discontinued     2 g 100 mL/hr over 30 Minutes Intravenous Every 24 hours 08/25/15 0914 08/26/15 0008   08/23/15 2200  cefTRIAXone (ROCEPHIN) 2 g in dextrose 5 % 50 mL IVPB  Status:  Discontinued     2 g 100 mL/hr over 30 Minutes Intravenous Every 24 hours 08/22/15 0658 08/23/15 1407   08/23/15 2130  piperacillin-tazobactam (ZOSYN) IVPB 3.375 g  Status:  Discontinued     3.375 g 12.5 mL/hr over 240 Minutes Intravenous Every 8 hours 08/23/15 1448 08/25/15 0914   08/23/15 1530  piperacillin-tazobactam (ZOSYN) IVPB 3.375 g     3.375 g 100 mL/hr over 30 Minutes Intravenous  Once 08/23/15 1448 08/23/15 1638   08/23/15 0800  vancomycin (VANCOCIN) IVPB 1000 mg/200 mL premix  Status:  Discontinued     1,000 mg 200 mL/hr over 60 Minutes Intravenous Every 8 hours 08/22/15 2317 08/23/15 1407   08/22/15 2330  cefTRIAXone (ROCEPHIN) 2 g in dextrose 5 % 50 mL IVPB     2 g 100 mL/hr over 30 Minutes Intravenous STAT 08/22/15 2317 08/23/15 0040   08/22/15 2330  vancomycin (VANCOCIN) 2,000 mg in sodium chloride 0.9 % 500 mL IVPB     2,000 mg 250 mL/hr over 120 Minutes Intravenous  Once 08/22/15 2317 08/23/15 0230   08/22/15 0700  cefTRIAXone (ROCEPHIN) 2 g in dextrose 5 % 50 mL IVPB  Status:  Discontinued     2 g 100 mL/hr over 30 Minutes Intravenous NOW 08/22/15 0658 08/22/15 1841      Medications: Scheduled Meds: . ampicillin-sulbactam (UNASYN) IV  3 g Intravenous Q6H  . busPIRone  5 mg Oral BID  . citalopram  20 mg Oral Daily  . folic acid  1 mg Oral Daily  . insulin aspart  0-5 Units Subcutaneous QHS  . insulin aspart   0-9 Units Subcutaneous TID WC  . pantoprazole  40 mg Intravenous Q12H  . sodium chloride flush  3 mL Intravenous Q12H  . thiamine  100 mg Oral Daily   Continuous Infusions:  PRN Meds:.sodium chloride, ALPRAZolam, [DISCONTINUED] ondansetron **OR** ondansetron (ZOFRAN) IV    Objective: Weight change:   Intake/Output Summary (Last 24 hours) at 08/26/15 1217 Last data filed at 08/26/15 0751  Gross per 24 hour  Intake              370 ml  Output             1875 ml  Net            -1505 ml   Blood pressure 116/72, pulse 71, temperature 97.8 F (36.6 C), temperature source Oral, resp. rate 13, height 6\' 4"  (1.93 m), weight 257 lb 15 oz (117 kg), SpO2 99 %. Temp:  [97.8 F (36.6 C)-98.8 F (37.1 C)] 97.8 F (36.6 C) (08/01 1145) Pulse Rate:  [68-99] 71 (08/01 1145) Resp:  [12-25] 13 (08/01 1145) BP: (106-136)/(68-87) 116/72 (08/01 1145) SpO2:  [97 %-100 %] 99 % (08/01 1145) Weight:  [257 lb  15 oz (117 kg)] 257 lb 15 oz (117 kg) (08/01 0800)  Physical Exam: General: Alert and awake, oriented x3, not in any acute distress. HEENT:EOMI CVS regular rate, normal r,  no murmur rubs or gallops Chest: clear to auscultation bilaterally, no wheezing, rales or rhonchi Abdomen: distended +bs Extremities: no  clubbing or edema noted bilaterally Neuro: nonfocal  CBC:  CBC Latest Ref Rng & Units 08/26/2015 08/25/2015 08/25/2015  WBC 4.0 - 10.5 K/uL 2.2(L) 3.7(L) 2.8(L)  Hemoglobin 13.0 - 17.0 g/dL 8.3(L) 8.3(L) 7.6(L)  Hematocrit 39.0 - 52.0 % 26.1(L) 25.0(L) 24.4(L)  Platelets 150 - 400 K/uL 34(L) PLATELET CLUMPS NOTED ON SMEAR, UNABLE TO ESTIMATE 29(LL)      BMET  Recent Labs  08/25/15 0440 08/26/15 0624  NA 144 139  K 3.3* 3.6  CL 108 103  CO2 28 31  GLUCOSE 163* 224*  BUN 20 8  CREATININE 0.83 0.72  CALCIUM 6.8* 7.6*     Liver Panel   Recent Labs  08/25/15 0440 08/26/15 0624  PROT 4.6* 5.0*  ALBUMIN 2.4* 2.5*  AST 372* 203*  ALT 314* 256*  ALKPHOS 48 52    BILITOT 5.1* 5.0*       Sedimentation Rate No results for input(s): ESRSEDRATE in the last 72 hours. C-Reactive Protein No results for input(s): CRP in the last 72 hours.  Micro Results: Recent Results (from the past 720 hour(s))  Culture, blood (routine x 2)     Status: Abnormal   Collection Time: 08/22/15  7:45 AM  Result Value Ref Range Status   Specimen Description BLOOD LEFT ANTECUBITAL  Final   Special Requests BOTTLES DRAWN AEROBIC AND ANAEROBIC 5CC  Final   Culture  Setup Time   Final    GRAM NEGATIVE RODS GRAM POSITIVE COCCI IN PAIRS AND CHAINS IN BOTH AEROBIC AND ANAEROBIC BOTTLES CRITICAL RESULT CALLED TO, READ BACK BY AND VERIFIED WITHMerlene Morse PHARMD 2254 08/22/15 A BROWNING    Culture (A)  Final    ESCHERICHIA COLI KLEBSIELLA PNEUMONIAE ENTEROCOCCUS SPECIES    Report Status 08/25/2015 FINAL  Final   Organism ID, Bacteria ESCHERICHIA COLI  Final   Organism ID, Bacteria KLEBSIELLA PNEUMONIAE  Final   Organism ID, Bacteria ENTEROCOCCUS SPECIES  Final      Susceptibility   Escherichia coli - MIC*    AMPICILLIN <=2 SENSITIVE Sensitive     CEFAZOLIN <=4 SENSITIVE Sensitive     CEFEPIME <=1 SENSITIVE Sensitive     CEFTAZIDIME <=1 SENSITIVE Sensitive     CEFTRIAXONE <=1 SENSITIVE Sensitive     CIPROFLOXACIN <=0.25 SENSITIVE Sensitive     GENTAMICIN <=1 SENSITIVE Sensitive     IMIPENEM <=0.25 SENSITIVE Sensitive     TRIMETH/SULFA <=20 SENSITIVE Sensitive     AMPICILLIN/SULBACTAM <=2 SENSITIVE Sensitive     PIP/TAZO <=4 SENSITIVE Sensitive     * ESCHERICHIA COLI   Enterococcus species - MIC*    AMPICILLIN <=2 SENSITIVE Sensitive     VANCOMYCIN 2 SENSITIVE Sensitive     GENTAMICIN SYNERGY SENSITIVE Sensitive     LINEZOLID 2 SENSITIVE Sensitive     * ENTEROCOCCUS SPECIES   Klebsiella pneumoniae - MIC*    AMPICILLIN >=32 RESISTANT Resistant     CEFAZOLIN <=4 SENSITIVE Sensitive     CEFEPIME <=1 SENSITIVE Sensitive     CEFTAZIDIME <=1 SENSITIVE Sensitive      CEFTRIAXONE <=1 SENSITIVE Sensitive     CIPROFLOXACIN <=0.25 SENSITIVE Sensitive     GENTAMICIN <=1 SENSITIVE Sensitive  IMIPENEM <=0.25 SENSITIVE Sensitive     TRIMETH/SULFA <=20 SENSITIVE Sensitive     AMPICILLIN/SULBACTAM 4 SENSITIVE Sensitive     PIP/TAZO <=4 SENSITIVE Sensitive     * KLEBSIELLA PNEUMONIAE  Blood Culture ID Panel (Reflexed)     Status: Abnormal   Collection Time: 08/22/15  7:45 AM  Result Value Ref Range Status   Enterococcus species DETECTED (A) NOT DETECTED Final    Comment: CRITICAL RESULT CALLED TO, READ BACK BY AND VERIFIED WITH: V BRYK PHARMD 2254 08/22/15 A BROWNING    Vancomycin resistance NOT DETECTED NOT DETECTED Final   Listeria monocytogenes NOT DETECTED NOT DETECTED Final   Staphylococcus species NOT DETECTED NOT DETECTED Final   Staphylococcus aureus NOT DETECTED NOT DETECTED Final   Methicillin resistance NOT DETECTED NOT DETECTED Final   Streptococcus species NOT DETECTED NOT DETECTED Final   Streptococcus agalactiae NOT DETECTED NOT DETECTED Final   Streptococcus pneumoniae NOT DETECTED NOT DETECTED Final   Streptococcus pyogenes NOT DETECTED NOT DETECTED Final   Acinetobacter baumannii NOT DETECTED NOT DETECTED Final   Enterobacteriaceae species DETECTED (A) NOT DETECTED Final    Comment: CRITICAL RESULT CALLED TO, READ BACK BY AND VERIFIED WITH: V BRYK PHARMD 2254 08/22/15 A BROWNING    Enterobacter cloacae complex NOT DETECTED NOT DETECTED Final   Escherichia coli DETECTED (A) NOT DETECTED Final    Comment: CRITICAL RESULT CALLED TO, READ BACK BY AND VERIFIED WITH: V BRYK PHARMD 2254 08/22/15 A BROWNING    Klebsiella oxytoca NOT DETECTED NOT DETECTED Final   Klebsiella pneumoniae DETECTED (A) NOT DETECTED Final    Comment: CRITICAL RESULT CALLED TO, READ BACK BY AND VERIFIED WITH: V BRYK PHARMD 2254 08/22/15 A BROWNING    Proteus species NOT DETECTED NOT DETECTED Final   Serratia marcescens NOT DETECTED NOT DETECTED Final   Carbapenem  resistance NOT DETECTED NOT DETECTED Final   Haemophilus influenzae NOT DETECTED NOT DETECTED Final   Neisseria meningitidis NOT DETECTED NOT DETECTED Final   Pseudomonas aeruginosa NOT DETECTED NOT DETECTED Final   Candida albicans NOT DETECTED NOT DETECTED Final   Candida glabrata NOT DETECTED NOT DETECTED Final   Candida krusei NOT DETECTED NOT DETECTED Final   Candida parapsilosis NOT DETECTED NOT DETECTED Final   Candida tropicalis NOT DETECTED NOT DETECTED Final  Culture, blood (routine x 2)     Status: None (Preliminary result)   Collection Time: 08/22/15  7:53 AM  Result Value Ref Range Status   Specimen Description BLOOD LEFT HAND  Final   Special Requests BOTTLES DRAWN AEROBIC ONLY 8CC  Final   Culture NO GROWTH 3 DAYS  Final   Report Status PENDING  Incomplete  MRSA PCR Screening     Status: None   Collection Time: 08/22/15  1:20 PM  Result Value Ref Range Status   MRSA by PCR NEGATIVE NEGATIVE Final    Comment:        The GeneXpert MRSA Assay (FDA approved for NASAL specimens only), is one component of a comprehensive MRSA colonization surveillance program. It is not intended to diagnose MRSA infection nor to guide or monitor treatment for MRSA infections.   Culture, Urine     Status: None   Collection Time: 08/25/15 10:31 AM  Result Value Ref Range Status   Specimen Description URINE, CATHETERIZED  Final   Special Requests NONE  Final   Culture NO GROWTH  Final   Report Status 08/26/2015 FINAL  Final    Studies/Results: No results found.  Assessment/Plan:  INTERVAL HISTORY:   08/26/15: AMp S enterococcus, E coli and klebsiella growing from cultures   Principal Problem:   Bleeding gastrointestinal Active Problems:   Alcoholism (HCC)   Depression   Diabetes mellitus without complication (HCC)   Hypercholesterolemia   Neuropathy (HCC)   Elevated lactic acid level   Thrombocytopenia (HCC)   Hemorrhagic shock   Acute respiratory failure (HCC)    Portal hypertension with esophageal varices (HCC)   Alcohol intoxication (HCC)   Elevated liver enzymes   Uncontrolled type 2 diabetes mellitus with ketoacidosis without coma, without long-term current use of insulin (HCC)    Bradley Harris is a 60 y.o. male with  Etohism, hx of gastric bypass, esophageal varices, admitted with GIB sp EGD with esophageal varices and MW tear found, also with polymicrobial bacteremia with enterococcus, E coli and klebsiella isolated --though only from 1/2 blood culture sites  #1 Polymicrobial bacteremia --check CT abdomen to ensure no intrabdominal abscess --changed to unasyn which will cover all bacteria isolated so far     LOS: 4 days   Acey Lav 08/26/2015, 12:17 PM

## 2015-08-26 NOTE — Progress Notes (Addendum)
PROGRESS NOTE                                                                                                                                                                                                             Patient Demographics:    Bradley Harris, is a 60 y.o. male, DOB - 1955-03-27, GPQ:[id number]  Admit date - 08/22/2015   Admitting Physician Chilton Greathouse, MD  Outpatient Primary MD for the patient is No PCP Per Patient  LOS - 4  Outpatient Specialists:none  Chief Complaint  Patient presents with  . GI Bleeding       Brief Narrative  60 year old alcoholic male (drinks one bottle of bourbon daily), with cirrhosis (ascites and portal hypertension), history of diabetes mellitus, hyperlipidemia and depression, gastric bypass surgery, admitted to the hospitalist service on 7/28 with several episodes of hematemesis and melena for 2 days. He is visiting his girlfriend from [place]. In the ED he was tachycardic and hypotensive which improved with 2 L normal saline bolus. His hemoglobin was 8.1 with lactate of 8 and platelets of 73. Alcohol level was 316, INR is 1.36 with transaminitis. Patient admitted to stepdown unit and [facility] consulted. Patient was ordered for PRBC transfusion but soon after he was started he got up and became tachycardic and hypotensive, possible large amount of dark stool. Patient transferred to ICU with hemorrhagic shock and placed on Levophed drip and intubated for airway protection. Patient had urgent EGD done showing bleeding Mallory-Weiss tear and hemostasis achieved with  Cautery. Also showed nonbleeding esophageal varices. Patient continued on octreotide and Protonix drip. Blood cultures on admission growing Escherichia coli, Klebsiella and enterococcus.   SIGNIFICANT EVENTS: 7/28 >>  Admitted to SDU  For active upper GI bleed, hypotension-->> transferred to ICU for hemorrhagic  shock 7/28 >> intubated to secure airway for EGD; EGD showing bleeding Mallory-Weiss tear and underwent cautery. Managed in the ICU on pressors. Polymicrobial bacteremia on blood cultures. 7/29 >> off pressors 7/30 >> Extubated 7/31>> transfer to stepdown on the hospitalist service.  LINES/TUBES: PIV 7/28 >  ETT 7/28 > 7/30 CVC R IJ 7/28 >  Foley 7/28 >     Subjective:   Patient reports passing some dark stools overnight. Denies any headache or dizziness or vomiting.   Assessment  & Plan :    Principal Problem:  Hemorrhagic shock secondary to bleeding Mallory-Weiss tear Required ICU admission with pressors and intubation for airway protection. EGD with cauterization of the bleeding tear. Of octreotide and PPI drip. Continue twice a day IV Protonix. Hemodynamically stable but still having some melena. H&H stable after 6 unit PRBC. Seen by surgery and recommend angioembolization if he has persistent GI bleed. Continue step down monitoring. Monitor H&H closely.  Active Problems: Lactic acidosis Secondary to shock and? Bacteremia. Now resolved.  Polymicrobial bacteremia Blood culture 1/2 growing enterococcus, Escherichia coli and Klebsiella. Antibiotics history Unasyn. I did recommends CT abdomen rule out intra-abdominal abscess.      Alcoholism (HCC) Has underlying portal hypertension with nonbleeding esophageal varices. Has shock liver secondary to primary insult. Chronic thrombocytopenia. LFTs slowly trending down. No signs of acute alcohol withdrawal at this time (although patient does get anxious).  Check liver ultrasound. Hepatitis panel negative. Continue thiamine, folate and multivitamin. Counseled on alcohol cessation.  Thrombocytopenia Secondary to underlying cirrhosis and shock. Monitor.   Hyperkalemia/hypomagnesemia Resolved/replenished  Diabetes mellitus type 2 Monitor on sliding scale coverage. On NovoLog at home.  Depression Continue risperidone and  Celexa.   Code Status : Full code  Family Communication  : None at bedside  Disposition Plan  :  Likely Home Pending clinical improvement  Barriers For Discharge :  Active symptoms  Consults  :   PC CM  [facility] ID    DVT Prophylaxis  : SCDs  Lab Results  Component Value Date   PLT 34 (L) 08/26/2015    Antibiotics  :    Anti-infectives    Start     Dose/Rate Route Frequency Ordered Stop   08/26/15 0030  Ampicillin-Sulbactam (UNASYN) 3 g in sodium chloride 0.9 % 100 mL IVPB     3 g 100 mL/hr over 60 Minutes Intravenous Every 6 hours 08/26/15 0016     08/25/15 1000  cefTRIAXone (ROCEPHIN) 2 g in dextrose 5 % 50 mL IVPB  Status:  Discontinued     2 g 100 mL/hr over 30 Minutes Intravenous Every 24 hours 08/25/15 0914 08/26/15 0008   08/23/15 2200  cefTRIAXone (ROCEPHIN) 2 g in dextrose 5 % 50 mL IVPB  Status:  Discontinued     2 g 100 mL/hr over 30 Minutes Intravenous Every 24 hours 08/22/15 0658 08/23/15 1407   08/23/15 2130  piperacillin-tazobactam (ZOSYN) IVPB 3.375 g  Status:  Discontinued     3.375 g 12.5 mL/hr over 240 Minutes Intravenous Every 8 hours 08/23/15 1448 08/25/15 0914   08/23/15 1530  piperacillin-tazobactam (ZOSYN) IVPB 3.375 g     3.375 g 100 mL/hr over 30 Minutes Intravenous  Once 08/23/15 1448 08/23/15 1638   08/23/15 0800  vancomycin (VANCOCIN) IVPB 1000 mg/200 mL premix  Status:  Discontinued     1,000 mg 200 mL/hr over 60 Minutes Intravenous Every 8 hours 08/22/15 2317 08/23/15 1407   08/22/15 2330  cefTRIAXone (ROCEPHIN) 2 g in dextrose 5 % 50 mL IVPB     2 g 100 mL/hr over 30 Minutes Intravenous STAT 08/22/15 2317 08/23/15 0040   08/22/15 2330  vancomycin (VANCOCIN) 2,000 mg in sodium chloride 0.9 % 500 mL IVPB     2,000 mg 250 mL/hr over 120 Minutes Intravenous  Once 08/22/15 2317 08/23/15 0230   08/22/15 0700  cefTRIAXone (ROCEPHIN) 2 g in dextrose 5 % 50 mL IVPB  Status:  Discontinued     2 g 100 mL/hr over 30 Minutes  Intravenous NOW 08/22/15 4742  08/22/15 1841        Objective:   Vitals:   08/26/15 0417 08/26/15 0748 08/26/15 0800 08/26/15 1145  BP: 106/68 123/74  116/72  Pulse:  68  71  Resp:  12  13  Temp: 98.8 F (37.1 C) 98.3 F (36.8 C)  97.8 F (36.6 C)  TempSrc: Oral Oral  Oral  SpO2:  100%  99%  Weight:   117 kg (257 lb 15 oz)   Height:        Wt Readings from Last 3 Encounters:  08/26/15 117 kg (257 lb 15 oz)     Intake/Output Summary (Last 24 hours) at 08/26/15 1335 Last data filed at 08/26/15 0751  Gross per 24 hour  Intake              360 ml  Output             1875 ml  Net            -1515 ml     Physical Exam  Gen: not in distress HEENT: Pallor present, moist mucosa, supple neck Chest: clear b/l, no added sounds CVS: N S1&S2, no murmurs, rubs or gallop GI: soft, NT, ND, BS+ Musculoskeletal: warm, no edema CNS: AAOX3, non focal    Data Review:    CBC  Recent Labs Lab 08/22/15 0147  08/22/15 0742  08/24/15 1455 08/24/15 2210 08/25/15 0440 08/25/15 1843 08/26/15 0624  WBC 4.3  < > 4.5  < > 3.2* 3.1* 2.8* 3.7* 2.2*  HGB 8.1*  < > 5.8*  < > 8.0* 7.6* 7.6* 8.3* 8.3*  HCT 25.7*  < > 19.3*  < > 25.4* 23.5* 24.4* 25.0* 26.1*  PLT 73*  < > 61*  < > 32* 25* 29* PLATELET CLUMPS NOTED ON SMEAR, UNABLE TO ESTIMATE 34*  MCV 86.5  < > 88.9  < > 87.0 86.7 88.1 85.3 87.3  MCH 27.3  < > 26.7  < > 27.4 28.0 27.4 28.3 27.8  MCHC 31.5  < > 30.1  < > 31.5 32.3 31.1 33.2 31.8  RDW 17.5*  < > 17.7*  < > 16.9* 17.1* 16.8* 16.5* 16.3*  LYMPHSABS 1.2  --  0.5*  --   --   --   --   --   --   MONOABS 0.3  --  0.5  --   --   --   --   --   --   EOSABS 0.0  --  0.0  --   --   --   --   --   --   BASOSABS 0.0  --  0.0  --   --   --   --   --   --   < > = values in this interval not displayed.  Chemistries   Recent Labs Lab 08/22/15 0742 08/22/15 1200 08/23/15 0259 08/23/15 0950 08/24/15 0350 08/24/15 1746 08/25/15 0440 08/26/15 0624  NA 144 145 143  --  145  --   144 139  K 5.6* 5.7* 4.6  --  3.7  --  3.3* 3.6  CL 110 111 107  --  110  --  108 103  CO2 12* 14* 20*  --  29  --  28 31  GLUCOSE 163* 161* 201*  --  197*  --  163* 224*  BUN 17 17 25*  --  29*  --  20 8  CREATININE 1.02 0.99 1.60*  --  0.98  --  0.83 0.72  CALCIUM 7.2* 7.2* 6.3*  --  6.7*  --  6.8* 7.6*  MG 1.5*  --   --  1.0*  --  1.3* 1.7  --   AST 763*  --  1,884*  --  594*  --  372* 203*  ALT 282*  --  531*  --  352*  --  314* 256*  ALKPHOS 84  --  64  --  47  --  48 52  BILITOT 2.6*  --  4.2*  --  5.0*  --  5.1* 5.0*   ------------------------------------------------------------------------------------------------------------------ No results for input(s): CHOL, HDL, LDLCALC, TRIG, CHOLHDL, LDLDIRECT in the last 72 hours.  Lab Results  Component Value Date   HGBA1C 7.2 (H) 08/22/2015   ------------------------------------------------------------------------------------------------------------------ No results for input(s): TSH, T4TOTAL, T3FREE, THYROIDAB in the last 72 hours.  Invalid input(s): FREET3 ------------------------------------------------------------------------------------------------------------------ No results for input(s): VITAMINB12, FOLATE, FERRITIN, TIBC, IRON, RETICCTPCT in the last 72 hours.  Coagulation profile  Recent Labs Lab 08/22/15 0742 08/23/15 0259 08/24/15 1746 08/25/15 0440 08/26/15 0624  INR 1.80 2.11 1.87 1.77 1.64    No results for input(s): DDIMER in the last 72 hours.  Cardiac Enzymes  Recent Labs Lab 08/22/15 0742  TROPONINI <0.03   ------------------------------------------------------------------------------------------------------------------    Component Value Date/Time   BNP 14.7 08/22/2015 0742    Inpatient Medications  Scheduled Meds: . ampicillin-sulbactam (UNASYN) IV  3 g Intravenous Q6H  . busPIRone  5 mg Oral BID  . citalopram  20 mg Oral Daily  . folic acid  1 mg Oral Daily  . insulin aspart  0-5  Units Subcutaneous QHS  . insulin aspart  0-9 Units Subcutaneous TID WC  . pantoprazole  40 mg Intravenous Q12H  . sodium chloride flush  3 mL Intravenous Q12H  . thiamine  100 mg Oral Daily   Continuous Infusions:  PRN Meds:.sodium chloride, ALPRAZolam, [DISCONTINUED] ondansetron **OR** ondansetron (ZOFRAN) IV  Micro Results Recent Results (from the past 240 hour(s))  Culture, blood (routine x 2)     Status: Abnormal   Collection Time: 08/22/15  7:45 AM  Result Value Ref Range Status   Specimen Description BLOOD LEFT ANTECUBITAL  Final   Special Requests BOTTLES DRAWN AEROBIC AND ANAEROBIC 5CC  Final   Culture  Setup Time   Final    GRAM NEGATIVE RODS GRAM POSITIVE COCCI IN PAIRS AND CHAINS IN BOTH AEROBIC AND ANAEROBIC BOTTLES CRITICAL RESULT CALLED TO, READ BACK BY AND VERIFIED WITHMerlene Morse PHARMD 2254 08/22/15 A BROWNING    Culture (A)  Final    ESCHERICHIA COLI KLEBSIELLA PNEUMONIAE ENTEROCOCCUS SPECIES    Report Status 08/25/2015 FINAL  Final   Organism ID, Bacteria ESCHERICHIA COLI  Final   Organism ID, Bacteria KLEBSIELLA PNEUMONIAE  Final   Organism ID, Bacteria ENTEROCOCCUS SPECIES  Final      Susceptibility   Escherichia coli - MIC*    AMPICILLIN <=2 SENSITIVE Sensitive     CEFAZOLIN <=4 SENSITIVE Sensitive     CEFEPIME <=1 SENSITIVE Sensitive     CEFTAZIDIME <=1 SENSITIVE Sensitive     CEFTRIAXONE <=1 SENSITIVE Sensitive     CIPROFLOXACIN <=0.25 SENSITIVE Sensitive     GENTAMICIN <=1 SENSITIVE Sensitive     IMIPENEM <=0.25 SENSITIVE Sensitive     TRIMETH/SULFA <=20 SENSITIVE Sensitive     AMPICILLIN/SULBACTAM <=2 SENSITIVE Sensitive     PIP/TAZO <=4 SENSITIVE Sensitive     * ESCHERICHIA COLI   Enterococcus species -  MIC*    AMPICILLIN <=2 SENSITIVE Sensitive     VANCOMYCIN 2 SENSITIVE Sensitive     GENTAMICIN SYNERGY SENSITIVE Sensitive     LINEZOLID 2 SENSITIVE Sensitive     * ENTEROCOCCUS SPECIES   Klebsiella pneumoniae - MIC*    AMPICILLIN >=32  RESISTANT Resistant     CEFAZOLIN <=4 SENSITIVE Sensitive     CEFEPIME <=1 SENSITIVE Sensitive     CEFTAZIDIME <=1 SENSITIVE Sensitive     CEFTRIAXONE <=1 SENSITIVE Sensitive     CIPROFLOXACIN <=0.25 SENSITIVE Sensitive     GENTAMICIN <=1 SENSITIVE Sensitive     IMIPENEM <=0.25 SENSITIVE Sensitive     TRIMETH/SULFA <=20 SENSITIVE Sensitive     AMPICILLIN/SULBACTAM 4 SENSITIVE Sensitive     PIP/TAZO <=4 SENSITIVE Sensitive     * KLEBSIELLA PNEUMONIAE  Blood Culture ID Panel (Reflexed)     Status: Abnormal   Collection Time: 08/22/15  7:45 AM  Result Value Ref Range Status   Enterococcus species DETECTED (A) NOT DETECTED Final    Comment: CRITICAL RESULT CALLED TO, READ BACK BY AND VERIFIED WITH: V BRYK PHARMD 2254 08/22/15 A BROWNING    Vancomycin resistance NOT DETECTED NOT DETECTED Final   Listeria monocytogenes NOT DETECTED NOT DETECTED Final   Staphylococcus species NOT DETECTED NOT DETECTED Final   Staphylococcus aureus NOT DETECTED NOT DETECTED Final   Methicillin resistance NOT DETECTED NOT DETECTED Final   Streptococcus species NOT DETECTED NOT DETECTED Final   Streptococcus agalactiae NOT DETECTED NOT DETECTED Final   Streptococcus pneumoniae NOT DETECTED NOT DETECTED Final   Streptococcus pyogenes NOT DETECTED NOT DETECTED Final   Acinetobacter baumannii NOT DETECTED NOT DETECTED Final   Enterobacteriaceae species DETECTED (A) NOT DETECTED Final    Comment: CRITICAL RESULT CALLED TO, READ BACK BY AND VERIFIED WITH: V BRYK PHARMD 2254 08/22/15 A BROWNING    Enterobacter cloacae complex NOT DETECTED NOT DETECTED Final   Escherichia coli DETECTED (A) NOT DETECTED Final    Comment: CRITICAL RESULT CALLED TO, READ BACK BY AND VERIFIED WITH: V BRYK PHARMD 2254 08/22/15 A BROWNING    Klebsiella oxytoca NOT DETECTED NOT DETECTED Final   Klebsiella pneumoniae DETECTED (A) NOT DETECTED Final    Comment: CRITICAL RESULT CALLED TO, READ BACK BY AND VERIFIED WITH: V BRYK PHARMD 2254  08/22/15 A BROWNING    Proteus species NOT DETECTED NOT DETECTED Final   Serratia marcescens NOT DETECTED NOT DETECTED Final   Carbapenem resistance NOT DETECTED NOT DETECTED Final   Haemophilus influenzae NOT DETECTED NOT DETECTED Final   Neisseria meningitidis NOT DETECTED NOT DETECTED Final   Pseudomonas aeruginosa NOT DETECTED NOT DETECTED Final   Candida albicans NOT DETECTED NOT DETECTED Final   Candida glabrata NOT DETECTED NOT DETECTED Final   Candida krusei NOT DETECTED NOT DETECTED Final   Candida parapsilosis NOT DETECTED NOT DETECTED Final   Candida tropicalis NOT DETECTED NOT DETECTED Final  Culture, blood (routine x 2)     Status: None (Preliminary result)   Collection Time: 08/22/15  7:53 AM  Result Value Ref Range Status   Specimen Description BLOOD LEFT HAND  Final   Special Requests BOTTLES DRAWN AEROBIC ONLY 8CC  Final   Culture NO GROWTH 3 DAYS  Final   Report Status PENDING  Incomplete  MRSA PCR Screening     Status: None   Collection Time: 08/22/15  1:20 PM  Result Value Ref Range Status   MRSA by PCR NEGATIVE NEGATIVE Final    Comment:  The GeneXpert MRSA Assay (FDA approved for NASAL specimens only), is one component of a comprehensive MRSA colonization surveillance program. It is not intended to diagnose MRSA infection nor to guide or monitor treatment for MRSA infections.   Culture, Urine     Status: None   Collection Time: 08/25/15 10:31 AM  Result Value Ref Range Status   Specimen Description URINE, CATHETERIZED  Final   Special Requests NONE  Final   Culture NO GROWTH  Final   Report Status 08/26/2015 FINAL  Final    Radiology Reports Dg Chest Port 1 View  Result Date: 08/24/2015 CLINICAL DATA:  Fluid overload EXAM: PORTABLE CHEST 1 VIEW COMPARISON:  08/23/2015 FINDINGS: Endotracheal tube and right dialysis catheter are unchanged. Improving aeration. No confluent airspace opacities, effusions or edema. Heart is normal size. No acute  bony abnormality. IMPRESSION: Stable support devices. No acute findings. Electronically Signed   By: Charlett Nose M.D.   On: 08/24/2015 08:15  Dg Chest Port 1 View  Result Date: 08/23/2015 CLINICAL DATA:  Fluid overload. EXAM: PORTABLE CHEST 1 VIEW COMPARISON:  One-view chest 08/22/2015 FINDINGS: The heart size is exaggerated by low lung volumes. Endotracheal tube and right IJ line are stable. Aeration is slightly improved. IMPRESSION: Improving aeration with persistent low lung volumes. Electronically Signed   By: Marin Roberts M.D.   On: 08/23/2015 12:28  Dg Chest Port 1 View  Result Date: 08/22/2015 CLINICAL DATA:  Status post intubation and central line placement today. EXAM: PORTABLE CHEST 1 VIEW COMPARISON:  Single-view of the chest earlier today. FINDINGS: Endotracheal tube is in place with the tip in good position 4 cm above the carina. Right IJ catheter tip projects in the lower superior vena cava. Negative for pneumothorax. Lungs are clear. Heart size is normal. No pleural effusion. IMPRESSION: ETT and right IJ catheter projecting good position. Negative for pneumothorax or acute disease. Electronically Signed   By: Drusilla Kanner M.D.   On: 08/22/2015 11:31  Dg Chest Port 1 View  Result Date: 08/22/2015 CLINICAL DATA:  Acute respiratory failure. EXAM: PORTABLE CHEST 1 VIEW COMPARISON:  None. FINDINGS: The heart size and mediastinal contours are within normal limits. Both lungs are clear. The visualized skeletal structures are unremarkable. IMPRESSION: No active disease. Electronically Signed   By: Ted Mcalpine M.D.   On: 08/22/2015 07:38   Time Spent in minutes  35   Eddie North M.D on 08/26/2015 at 1:35 PM  Between 7am to 7pm - Pager - [phone]  After 7pm go to [email or web address] - password [facility]  [facility] -  Office  [phone]

## 2015-08-26 NOTE — Progress Notes (Signed)
Pharmacy Antibiotic Note  Bradley Harris is a 60 y.o. male admitted on 08/22/2015, now with bacteremia.  Pharmacy has been consulted for Unasyn dosing.  Plan: Unasyn 3g IV Q6H.  Height: 6\' 4"  (193 cm) Weight: 249 lb 12.5 oz (113.3 kg) IBW/kg (Calculated) : 86.8  Temp (24hrs), Avg:98.4 F (36.9 C), Min:97.9 F (36.6 C), Max:98.7 F (37.1 C)   Recent Labs Lab 08/22/15 0742  08/22/15 1200 08/22/15 1345  08/23/15 0259 08/23/15 0940  08/23/15 1145  08/24/15 0350 08/24/15 0630 08/24/15 1455 08/24/15 2210 08/25/15 0440 08/25/15 1843  WBC 4.5  < >  --  9.3  < > 11.7*  --   < >  --   < >  --  3.4* 3.2* 3.1* 2.8* 3.7*  CREATININE 1.02  --  0.99  --   --  1.60*  --   --   --   --  0.98  --   --   --  0.83  --   LATICACIDVEN 9.6*  --   --  9.8*  --  8.1* 6.1*  --  4.2*  --   --   --   --   --   --   --   < > = values in this interval not displayed.  Estimated Creatinine Clearance: 135.3 mL/min (by C-G formula based on SCr of 0.83 mg/dL).    No Known Allergies  Antimicrobials this admission: 7/29 ctx x1; 7/31 >> 8/1 7/29 vanc x1 7/29 Zosyn >> 7/31 8/1 Unasyn >>    Microbiology results: 7/28 Blood Cx: enterococcus, e.coli, klebisella  7/28 Urine >> cancelled 7/28 Resp Cx >> cancelled  Thank you for allowing pharmacy to be a part of this patient's care.  Vernard Gambles, PharmD, BCPS  08/26/2015 12:13 AM

## 2015-08-26 NOTE — Progress Notes (Signed)
PT Cancellation Note  Patient Details Name: Bradley Harris MRN: [id number] DOB: December 13, 1955   Cancelled Treatment:    Reason Eval/Treat Not Completed: Patient not medically ready   Patient reports he is waiting for nursing to assist him to "get cleaned up" due to ?incontinence. Offered to assist patient and he wanted nursing to assist. Explained PT will not be able to return again today and he said he would "be ready tomorrow."   [name] 08/26/2015, 4:03 PM  Pager [phone]

## 2015-08-26 NOTE — Progress Notes (Signed)
OT Cancellation Note  Patient Details Name: Edvardo Seminara MRN: [id number] DOB: 04-Apr-1955   Cancelled Treatment:    Reason Eval/Treat Not Completed: Patient at procedure or test/ unavailable.  Pt reports he is going to CT and therefore, unable to get up at this time.  Will reattempt.   Dnya Hickle [place], OTR/L [phone]   Jeani Hawking M 08/26/2015, 1:09 PM

## 2015-08-26 NOTE — Progress Notes (Signed)
PT Cancellation Note  Patient Details Name: Bradley Harris MRN: [id number] DOB: 10/17/1955   Cancelled Treatment:    Reason Eval/Treat Not Completed: Patient declined, stating he is 1) afraid he will be incontinent of diarrhea, and 2) he wants to eat his breakfast. Multiple times he repeated he is not refusing, and is not going to do PT eval right now. He stated PT could return later today.   Florina Glas 08/26/2015, 8:59 AM  Pager [phone]

## 2015-08-26 NOTE — Progress Notes (Signed)
CRITICAL VALUE ALERT  Critical value received: 1500  Date of notification:  08/26/15  Time of notification:1520  Critical value read back: yes  Nurse who received alert: Johnny Bridge  MD notified (1st page):  Dhung  Time of first page:  1520  MD notified: none  Time of second page: none  Responding MD:  Dhung  Time MD responded:  1600

## 2015-08-27 LAB — COMPREHENSIVE METABOLIC PANEL
ALBUMIN: 2.4 g/dL — AB (ref 3.5–5.0)
ALK PHOS: 67 U/L (ref 38–126)
ALT: 189 U/L — ABNORMAL HIGH (ref 17–63)
AST: 128 U/L — AB (ref 15–41)
Anion gap: 6 (ref 5–15)
BILIRUBIN TOTAL: 5.9 mg/dL — AB (ref 0.3–1.2)
BUN: 5 mg/dL — AB (ref 6–20)
CALCIUM: 7.9 mg/dL — AB (ref 8.9–10.3)
CO2: 29 mmol/L (ref 22–32)
CREATININE: 0.7 mg/dL (ref 0.61–1.24)
Chloride: 104 mmol/L (ref 101–111)
GFR calc Af Amer: >60 mL/min (ref >60)
GLUCOSE: 195 mg/dL — AB (ref 65–99)
Potassium: 3.5 mmol/L (ref 3.5–5.1)
Sodium: 139 mmol/L (ref 135–145)
TOTAL PROTEIN: 4.9 g/dL — AB (ref 6.5–8.1)

## 2015-08-27 LAB — CBC
HCT: 26.4 % — ABNORMAL LOW (ref 39.0–52.0)
HEMOGLOBIN: 8.6 g/dL — AB (ref 13.0–17.0)
MCH: 28.3 pg (ref 26.0–34.0)
MCHC: 32.6 g/dL (ref 30.0–36.0)
MCV: 86.8 fL (ref 78.0–100.0)
Platelets: 44 10*3/uL — ABNORMAL LOW (ref 150–400)
RBC: 3.04 MIL/uL — AB (ref 4.22–5.81)
RDW: 16.5 % — ABNORMAL HIGH (ref 11.5–15.5)
WBC: 1.7 10*3/uL — ABNORMAL LOW (ref 4.0–10.5)

## 2015-08-27 LAB — GLUCOSE, CAPILLARY
GLUCOSE-CAPILLARY: 167 mg/dL — AB (ref 65–99)
GLUCOSE-CAPILLARY: 199 mg/dL — AB (ref 65–99)
GLUCOSE-CAPILLARY: 174 mg/dL — AB (ref 65–99)
GLUCOSE-CAPILLARY: 178 mg/dL — AB (ref 65–99)

## 2015-08-27 LAB — PROTIME-INR
INR: 1.48
Prothrombin Time: 18.1 seconds — ABNORMAL HIGH (ref 11.4–15.2)

## 2015-08-27 LAB — CULTURE, BLOOD (ROUTINE X 2): Culture: NO GROWTH

## 2015-08-27 LAB — HEMOGLOBIN A1C
Hgb A1c MFr Bld: 6.3 % — ABNORMAL HIGH (ref 4.8–5.6)
Mean Plasma Glucose: 134 mg/dL

## 2015-08-27 MED ORDER — NADOLOL 20 MG PO TABS
20.0000 mg | ORAL_TABLET | Freq: Two times a day (BID) | ORAL | Status: DC
  Administered 2015-08-28: 20 mg via ORAL
  Filled 2015-08-27 (×4): qty 1

## 2015-08-27 NOTE — Evaluation (Signed)
Occupational Therapy Evaluation Patient Details Name: Bradley Harris MRN: [id number] DOB: 22-Mar-1955 Today's Date: 08/27/2015    History of Present Illness 60 y.o. male with  Etohism, hx of gastric bypass, esophageal varices, admitted with GIB sp EGD with esophageal varices and MW tear found, also with polymicrobial bacteremia    Clinical Impression   Pt admitted with above. He demonstrates the below listed deficits and will benefit from continued OT to maximize safety and independence with BADLs.  Pt presents to OT with generalized weakness, decreased activity tolerance, and decreased balance.  He requires min A for ADLs.  Will follow acutely.       Follow Up Recommendations  No OT follow up;Supervision - Intermittent    Equipment Recommendations  Tub/shower seat    Recommendations for Other Services       Precautions / Restrictions Precautions Precautions: Fall      Mobility Bed Mobility Overal bed mobility: Modified Independent                Transfers Overall transfer level: Needs assistance Equipment used: 1 person hand held assist Transfers: Sit to/from Stand;Stand Pivot Transfers Sit to Stand: Min assist Stand pivot transfers: Min assist       General transfer comment: requires min A for balance    Balance Overall balance assessment: Needs assistance Sitting-balance support: No upper extremity supported Sitting balance-Leahy Scale: Good     Standing balance support: No upper extremity supported Standing balance-Leahy Scale: Fair                              ADL Overall ADL's : Needs assistance/impaired Eating/Feeding: Independent   Grooming: Min guard;Standing   Upper Body Bathing: Set up;Sitting   Lower Body Bathing: Minimal assistance;Sit to/from stand   Upper Body Dressing : Supervision/safety;Sitting   Lower Body Dressing: Minimal assistance;Sit to/from stand   Toilet Transfer: Minimal assistance;Ambulation;Comfort  height toilet;Grab bars   Toileting- Clothing Manipulation and Hygiene: Minimal assistance;Sit to/from stand       Functional mobility during ADLs: Minimal assistance General ADL Comments: pt fatigues     Vision     Perception     Praxis      Pertinent Vitals/Pain Pain Assessment: No/denies pain     Hand Dominance     Extremity/Trunk Assessment Upper Extremity Assessment Upper Extremity Assessment: Generalized weakness   Lower Extremity Assessment Lower Extremity Assessment: Defer to PT evaluation   Cervical / Trunk Assessment Cervical / Trunk Assessment: Normal   Communication Communication Communication: No difficulties   Cognition Arousal/Alertness: Awake/alert Behavior During Therapy: Flat affect Overall Cognitive Status: Within Functional Limits for tasks assessed                     General Comments       Exercises       Shoulder Instructions      Home Living Family/patient expects to be discharged to:: Private residence Living Arrangements: Spouse/significant other Available Help at Discharge: Friend(s);Available PRN/intermittently Type of Home: House Home Access: Stairs to enter [employer] of Steps: 3   Home Layout: One level     Bathroom Shower/Tub: Chief Strategy Officer: Standard     Home Equipment: None   Additional Comments: lives in [place] on second floor apt; drove to [place]      Prior Functioning/Environment Level of Independence: Independent             OT  Diagnosis: Generalized weakness   OT Problem List: Decreased strength;Decreased activity tolerance;Impaired balance (sitting and/or standing);Decreased knowledge of use of DME or AE   OT Treatment/Interventions: Self-care/ADL training;Therapeutic exercise;DME and/or AE instruction;Therapeutic activities;Patient/family education;Balance training    OT Goals(Current goals can be found in the care plan section) Acute Rehab OT  Goals Patient Stated Goal: get well and leave hospital OT Goal Formulation: With patient Time For Goal Achievement: 09/10/15 Potential to Achieve Goals: Good ADL Goals Pt Will Perform Grooming: with modified independence;standing Pt Will Perform Upper Body Bathing: with modified independence;sitting Pt Will Perform Lower Body Bathing: with modified independence;sit to/from stand Pt Will Perform Upper Body Dressing: with modified independence;sitting Pt Will Perform Lower Body Dressing: with modified independence;sit to/from stand Pt Will Transfer to Toilet: with modified independence;ambulating;regular height toilet;grab bars Pt Will Perform Toileting - Clothing Manipulation and hygiene: with modified independence;sit to/from stand Pt Will Perform Tub/Shower Transfer: Tub transfer;with supervision;ambulating;shower seat  OT Frequency: Min 2X/week   Barriers to D/C: Decreased caregiver support          Co-evaluation              End of Session Nurse Communication: Mobility status  Activity Tolerance: Patient tolerated treatment well Patient left: in bed;with call bell/phone within reach;with bed alarm set   Time: 4401-0272 OT Time Calculation (min): 44 min Charges:  OT General Charges $OT Visit: 1 Procedure OT Evaluation $OT Eval Low Complexity: 1 Procedure OT Treatments $Self Care/Home Management : 23-37 mins G-Codes:    Tanessa Tidd M Sep 21, 2015, 5:17 PM

## 2015-08-27 NOTE — Evaluation (Signed)
Physical Therapy Evaluation Patient Details Name: Bradley Harris MRN: [id number] DOB: 21-Jan-1956 Today's Date: 08/27/2015   History of Present Illness  60 y.o. male with  Etohism, hx of gastric bypass, esophageal varices, admitted with GIB sp EGD with esophageal varices and MW tear found, also with polymicrobial bacteremia     Clinical Impression  Pt admitted with above diagnosis. Pt currently with functional limitations due to the deficits listed below (see PT Problem List). Patient with decr activity tolerance due to acute GI bleed and prolonged bedrest. Anticipate quick progress. Pt will benefit from skilled PT to increase their independence and safety with mobility to allow discharge to the venue listed below.       Follow Up Recommendations No PT follow up    Equipment Recommendations  None recommended by PT    Recommendations for Other Services OT consult     Precautions / Restrictions Precautions Precautions: Fall      Mobility  Bed Mobility Overal bed mobility: Modified Independent             General bed mobility comments: incr time and encouragement  Transfers Overall transfer level: Needs assistance Equipment used: 1 person hand held assist Transfers: Sit to/from Stand;Stand Pivot Transfers Sit to Stand: Min assist Stand pivot transfers: Min assist       General transfer comment: pt seeking UE support of bed rail and IV pole; "felt weak" not orthostatic  Ambulation/Gait             General Gait Details: deferred; pt felt too weak to attempt  Stairs            Wheelchair Mobility    Modified Rankin (Stroke Patients Only)       Balance Overall balance assessment: Needs assistance Sitting-balance support: No upper extremity supported;Feet supported Sitting balance-Leahy Scale: Good     Standing balance support: No upper extremity supported Standing balance-Leahy Scale: Fair                               Pertinent  Vitals/Pain BP pre- and post-activity WNL with appropriate incr  Pain Assessment: No/denies pain    Home Living Family/patient expects to be discharged to:: Private residence Living Arrangements: Spouse/significant other (visiting ex-girlfriend "trying to work on relationship") Available Help at Discharge: Friend(s);Available PRN/intermittently Type of Home: House Home Access: Stairs to enter   [employer] of Steps: 3 Home Layout: One level Home Equipment: None Additional Comments: lives in [place] on second floor apt; drove to [place]    Prior Function Level of Independence: Independent               Hand Dominance        Extremity/Trunk Assessment   Upper Extremity Assessment: Overall WFL for tasks assessed           Lower Extremity Assessment: Overall WFL for tasks assessed      Cervical / Trunk Assessment: Normal  Communication   Communication: No difficulties  Cognition Arousal/Alertness: Awake/alert Behavior During Therapy: Flat affect Overall Cognitive Status: Within Functional Limits for tasks assessed                      General Comments General comments (skin integrity, edema, etc.): Very modest and does not like that he needs assist or that he has episodes of bowel incontinence. Educated on benefits of incr activity and that nursing can assist him with walking to bathroom  or in hall.    Exercises        Assessment/Plan    PT Assessment Patient needs continued PT services  PT Diagnosis Difficulty walking   PT Problem List Decreased activity tolerance;Decreased balance;Decreased mobility;Decreased knowledge of use of DME  PT Treatment Interventions DME instruction;Gait training;Stair training;Functional mobility training;Therapeutic activities;Patient/family education   PT Goals (Current goals can be found in the Care Plan section) Acute Rehab PT Goals Patient Stated Goal: get well and leave hospital PT Goal Formulation: With  patient Time For Goal Achievement: 09/03/15 Potential to Achieve Goals: Good    Frequency Min 3X/week   Barriers to discharge        Co-evaluation               End of Session Equipment Utilized During Treatment: Gait belt Activity Tolerance: Patient limited by fatigue Patient left: in chair;with call bell/phone within reach Nurse Communication: Mobility status         Time: 1610-9604 PT Time Calculation (min) (ACUTE ONLY): 28 min   Charges:         PT G Codes:        Miko Sirico 09-21-2015, 1:18 PM  Pager [phone]

## 2015-08-27 NOTE — Progress Notes (Signed)
Subjective: No new complaints   Antibiotics:  Anti-infectives    Start     Dose/Rate Route Frequency Ordered Stop   08/26/15 0030  Ampicillin-Sulbactam (UNASYN) 3 g in sodium chloride 0.9 % 100 mL IVPB     3 g 100 mL/hr over 60 Minutes Intravenous Every 6 hours 08/26/15 0016     08/25/15 1000  cefTRIAXone (ROCEPHIN) 2 g in dextrose 5 % 50 mL IVPB  Status:  Discontinued     2 g 100 mL/hr over 30 Minutes Intravenous Every 24 hours 08/25/15 0914 08/26/15 0008   08/23/15 2200  cefTRIAXone (ROCEPHIN) 2 g in dextrose 5 % 50 mL IVPB  Status:  Discontinued     2 g 100 mL/hr over 30 Minutes Intravenous Every 24 hours 08/22/15 0658 08/23/15 1407   08/23/15 2130  piperacillin-tazobactam (ZOSYN) IVPB 3.375 g  Status:  Discontinued     3.375 g 12.5 mL/hr over 240 Minutes Intravenous Every 8 hours 08/23/15 1448 08/25/15 0914   08/23/15 1530  piperacillin-tazobactam (ZOSYN) IVPB 3.375 g     3.375 g 100 mL/hr over 30 Minutes Intravenous  Once 08/23/15 1448 08/23/15 1638   08/23/15 0800  vancomycin (VANCOCIN) IVPB 1000 mg/200 mL premix  Status:  Discontinued     1,000 mg 200 mL/hr over 60 Minutes Intravenous Every 8 hours 08/22/15 2317 08/23/15 1407   08/22/15 2330  cefTRIAXone (ROCEPHIN) 2 g in dextrose 5 % 50 mL IVPB     2 g 100 mL/hr over 30 Minutes Intravenous STAT 08/22/15 2317 08/23/15 0040   08/22/15 2330  vancomycin (VANCOCIN) 2,000 mg in sodium chloride 0.9 % 500 mL IVPB     2,000 mg 250 mL/hr over 120 Minutes Intravenous  Once 08/22/15 2317 08/23/15 0230   08/22/15 0700  cefTRIAXone (ROCEPHIN) 2 g in dextrose 5 % 50 mL IVPB  Status:  Discontinued     2 g 100 mL/hr over 30 Minutes Intravenous NOW 08/22/15 0658 08/22/15 1841      Medications: Scheduled Meds: . ampicillin-sulbactam (UNASYN) IV  3 g Intravenous Q6H  . busPIRone  5 mg Oral BID  . citalopram  20 mg Oral Daily  . folic acid  1 mg Oral Daily  . insulin aspart  0-5 Units Subcutaneous QHS  . insulin aspart   0-9 Units Subcutaneous TID WC  . nadolol  20 mg Oral BID  . pantoprazole  40 mg Intravenous Q12H  . sodium chloride flush  3 mL Intravenous Q12H  . thiamine  100 mg Oral Daily   Continuous Infusions:  PRN Meds:.sodium chloride, ALPRAZolam, [DISCONTINUED] ondansetron **OR** ondansetron (ZOFRAN) IV    Objective: Weight change:   Intake/Output Summary (Last 24 hours) at 08/27/15 1308 Last data filed at 08/27/15 1057  Gross per 24 hour  Intake              663 ml  Output              325 ml  Net              338 ml   Blood pressure 109/74, pulse 83, temperature 98 F (36.7 C), temperature source Oral, resp. rate 18, height 6\' 4"  (1.93 m), weight 251 lb 8.7 oz (114.1 kg), SpO2 99 %. Temp:  [98 F (36.7 C)-98.8 F (37.1 C)] 98 F (36.7 C) (08/02 1214) Pulse Rate:  [74-83] 83 (08/02 1200) Resp:  [12-21] 18 (08/02 1200) BP: (104-122)/(67-81) 109/74 (08/02 1214) SpO2:  [  98 %-100 %] 99 % (08/02 1200) Weight:  [251 lb 8.7 oz (114.1 kg)] 251 lb 8.7 oz (114.1 kg) (08/02 0500)  Physical Exam: General: Alert and awake, oriented x3, not in any acute distress. HEENT:EOMI CVS regular rate, normal r,  no murmur rubs or gallops Chest: clear to auscultation bilaterally, no wheezing, rales or rhonchi Abdomen: distended +bs Extremities: no  clubbing or edema noted bilaterally Neuro: nonfocal  CBC:  CBC Latest Ref Rng & Units 08/27/2015 08/26/2015 08/26/2015  WBC 4.0 - 10.5 K/uL 1.7(L) 1.0(LL) 2.2(L)  Hemoglobin 13.0 - 17.0 g/dL 8.8(F) 0.2(D) 8.3(L)  Hematocrit 39.0 - 52.0 % 26.4(L) 24.4(L) 26.1(L)  Platelets 150 - 400 K/uL 44(L) 39(L) 34(L)      BMET  Recent Labs  08/26/15 0624 08/27/15 0516  NA 139 139  K 3.6 3.5  CL 103 104  CO2 31 29  GLUCOSE 224* 195*  BUN 8 5*  CREATININE 0.72 0.70  CALCIUM 7.6* 7.9*     Liver Panel   Recent Labs  08/26/15 0624 08/27/15 0516  PROT 5.0* 4.9*  ALBUMIN 2.5* 2.4*  AST 203* 128*  ALT 256* 189*  ALKPHOS 52 67  BILITOT 5.0* 5.9*        Sedimentation Rate No results for input(s): ESRSEDRATE in the last 72 hours. C-Reactive Protein No results for input(s): CRP in the last 72 hours.  Micro Results: Recent Results (from the past 720 hour(s))  Culture, blood (routine x 2)     Status: Abnormal   Collection Time: 08/22/15  7:45 AM  Result Value Ref Range Status   Specimen Description BLOOD LEFT ANTECUBITAL  Final   Special Requests BOTTLES DRAWN AEROBIC AND ANAEROBIC 5CC  Final   Culture  Setup Time   Final    GRAM NEGATIVE RODS GRAM POSITIVE COCCI IN PAIRS AND CHAINS IN BOTH AEROBIC AND ANAEROBIC BOTTLES CRITICAL RESULT CALLED TO, READ BACK BY AND VERIFIED WITHMerlene Morse PHARMD 2254 08/22/15 A BROWNING    Culture (A)  Final    ESCHERICHIA COLI KLEBSIELLA PNEUMONIAE ENTEROCOCCUS SPECIES    Report Status 08/25/2015 FINAL  Final   Organism ID, Bacteria ESCHERICHIA COLI  Final   Organism ID, Bacteria KLEBSIELLA PNEUMONIAE  Final   Organism ID, Bacteria ENTEROCOCCUS SPECIES  Final      Susceptibility   Escherichia coli - MIC*    AMPICILLIN <=2 SENSITIVE Sensitive     CEFAZOLIN <=4 SENSITIVE Sensitive     CEFEPIME <=1 SENSITIVE Sensitive     CEFTAZIDIME <=1 SENSITIVE Sensitive     CEFTRIAXONE <=1 SENSITIVE Sensitive     CIPROFLOXACIN <=0.25 SENSITIVE Sensitive     GENTAMICIN <=1 SENSITIVE Sensitive     IMIPENEM <=0.25 SENSITIVE Sensitive     TRIMETH/SULFA <=20 SENSITIVE Sensitive     AMPICILLIN/SULBACTAM <=2 SENSITIVE Sensitive     PIP/TAZO <=4 SENSITIVE Sensitive     * ESCHERICHIA COLI   Enterococcus species - MIC*    AMPICILLIN <=2 SENSITIVE Sensitive     VANCOMYCIN 2 SENSITIVE Sensitive     GENTAMICIN SYNERGY SENSITIVE Sensitive     LINEZOLID 2 SENSITIVE Sensitive     * ENTEROCOCCUS SPECIES   Klebsiella pneumoniae - MIC*    AMPICILLIN >=32 RESISTANT Resistant     CEFAZOLIN <=4 SENSITIVE Sensitive     CEFEPIME <=1 SENSITIVE Sensitive     CEFTAZIDIME <=1 SENSITIVE Sensitive     CEFTRIAXONE <=1  SENSITIVE Sensitive     CIPROFLOXACIN <=0.25 SENSITIVE Sensitive     GENTAMICIN <=1  SENSITIVE Sensitive     IMIPENEM <=0.25 SENSITIVE Sensitive     TRIMETH/SULFA <=20 SENSITIVE Sensitive     AMPICILLIN/SULBACTAM 4 SENSITIVE Sensitive     PIP/TAZO <=4 SENSITIVE Sensitive     * KLEBSIELLA PNEUMONIAE  Blood Culture ID Panel (Reflexed)     Status: Abnormal   Collection Time: 08/22/15  7:45 AM  Result Value Ref Range Status   Enterococcus species DETECTED (A) NOT DETECTED Final    Comment: CRITICAL RESULT CALLED TO, READ BACK BY AND VERIFIED WITH: V BRYK PHARMD 2254 08/22/15 A BROWNING    Vancomycin resistance NOT DETECTED NOT DETECTED Final   Listeria monocytogenes NOT DETECTED NOT DETECTED Final   Staphylococcus species NOT DETECTED NOT DETECTED Final   Staphylococcus aureus NOT DETECTED NOT DETECTED Final   Methicillin resistance NOT DETECTED NOT DETECTED Final   Streptococcus species NOT DETECTED NOT DETECTED Final   Streptococcus agalactiae NOT DETECTED NOT DETECTED Final   Streptococcus pneumoniae NOT DETECTED NOT DETECTED Final   Streptococcus pyogenes NOT DETECTED NOT DETECTED Final   Acinetobacter baumannii NOT DETECTED NOT DETECTED Final   Enterobacteriaceae species DETECTED (A) NOT DETECTED Final    Comment: CRITICAL RESULT CALLED TO, READ BACK BY AND VERIFIED WITH: V BRYK PHARMD 2254 08/22/15 A BROWNING    Enterobacter cloacae complex NOT DETECTED NOT DETECTED Final   Escherichia coli DETECTED (A) NOT DETECTED Final    Comment: CRITICAL RESULT CALLED TO, READ BACK BY AND VERIFIED WITH: V BRYK PHARMD 2254 08/22/15 A BROWNING    Klebsiella oxytoca NOT DETECTED NOT DETECTED Final   Klebsiella pneumoniae DETECTED (A) NOT DETECTED Final    Comment: CRITICAL RESULT CALLED TO, READ BACK BY AND VERIFIED WITH: V BRYK PHARMD 2254 08/22/15 A BROWNING    Proteus species NOT DETECTED NOT DETECTED Final   Serratia marcescens NOT DETECTED NOT DETECTED Final   Carbapenem resistance NOT  DETECTED NOT DETECTED Final   Haemophilus influenzae NOT DETECTED NOT DETECTED Final   Neisseria meningitidis NOT DETECTED NOT DETECTED Final   Pseudomonas aeruginosa NOT DETECTED NOT DETECTED Final   Candida albicans NOT DETECTED NOT DETECTED Final   Candida glabrata NOT DETECTED NOT DETECTED Final   Candida krusei NOT DETECTED NOT DETECTED Final   Candida parapsilosis NOT DETECTED NOT DETECTED Final   Candida tropicalis NOT DETECTED NOT DETECTED Final  Culture, blood (routine x 2)     Status: None (Preliminary result)   Collection Time: 08/22/15  7:53 AM  Result Value Ref Range Status   Specimen Description BLOOD LEFT HAND  Final   Special Requests BOTTLES DRAWN AEROBIC ONLY 8CC  Final   Culture NO GROWTH 4 DAYS  Final   Report Status PENDING  Incomplete  MRSA PCR Screening     Status: None   Collection Time: 08/22/15  1:20 PM  Result Value Ref Range Status   MRSA by PCR NEGATIVE NEGATIVE Final    Comment:        The GeneXpert MRSA Assay (FDA approved for NASAL specimens only), is one component of a comprehensive MRSA colonization surveillance program. It is not intended to diagnose MRSA infection nor to guide or monitor treatment for MRSA infections.   Culture, Urine     Status: None   Collection Time: 08/25/15 10:31 AM  Result Value Ref Range Status   Specimen Description URINE, CATHETERIZED  Final   Special Requests NONE  Final   Culture NO GROWTH  Final   Report Status 08/26/2015 FINAL  Final  Studies/Results: US Abdomen Complete  Result Date: 08/26/2015 CLINICAL DATA:  60 year old male with abnormal LFTs. Initial encounter. EXAM: ABDOMEN ULTRASOUND COMPLETE COMPARISON:  CT Abdomen and Pelvis 2150 hours today, reported separately. FINDINGS: Gallbladder: Small shadowing stones in the neck of the gallbladder (image 4). Estimated individual calculus size 7 mm. Gallbladder wall thickness remains normal. No sonographic Murphy sign elicited. Common bile duct: Diameter: 5  mm, normal Liver: Echogenic. Nodular contour (image 22). No discrete liver lesion. No intrahepatic biliary ductal dilatation. IVC: No abnormality visualized. Pancreas: Could not be visualized due to overlying bowel gas. Spleen: Splenomegaly. Estimated splenic volume 1096 mL (normal splenic volume range 83 - 412 mL). No discrete splenic lesion. Right Kidney: Length: 12.9 cm. Echogenicity within normal limits. No mass or hydronephrosis visualized. Left Kidney: Length: 13 cm. Echogenicity within normal limits. No mass or hydronephrosis visualized. Abdominal aorta: No aneurysm visualized. Other findings: Small volume ascites in the upper abdomen. IMPRESSION: 1. Hepatic steatosis with nodular liver contour suggesting Cirrhosis. 2. Splenomegaly and ascites suggesting portal hypertension. 3. Cholelithiasis without strong evidence of acute cholecystitis. No evidence of biliary obstruction. 4. See also CT Abdomen and Pelvis from today reported separately. Electronically Signed   By: Odessa Fleming M.D.   On: 08/26/2015 23:33   Ct Abdomen Pelvis W Contrast  Result Date: 08/26/2015 CLINICAL DATA:  Sepsis. GI bleed, Mallory-Weiss tear with cautery 4 days prior. EXAM: CT ABDOMEN AND PELVIS WITH CONTRAST TECHNIQUE: Multidetector CT imaging of the abdomen and pelvis was performed using the standard protocol following bolus administration of intravenous contrast. CONTRAST:  ISOVUE-300 IOPAMIDOL (ISOVUE-300) INJECTION 61% COMPARISON:  No prior abdominal imaging. FINDINGS: Lower chest: Small bilateral pleural effusions with adjacent atelectasis, right greater than left. Coronary artery calcifications are seen. Liver: Enlarged measuring 23 cm cranial caudal. Diffusely decreased density. No discrete focal hepatic lesion. There are nodular hepatic contours. Hepatobiliary: Calcified gallstones in the dependent gallbladder. No abnormal gallbladder distention. No biliary dilatation. Pancreas: No ductal dilatation or inflammation. Spleen:  Enlarged measuring 8.6 x 14.4 x 19.7 cm (volume = 1268.6 cc). Small splenic cleft. Adrenal glands: No nodule. Kidneys: Symmetric renal enhancement and excretion. No hydronephrosis. No perinephric edema. Stomach/Bowel: Post gastric bypass. The gastric pouch appears small with enteric chain sutures located above the diaphragm. There is thickening of the distal esophagus. Esophageal varices are noted. Excluded gastric remnant is decompressed. The jejunal-jejunal anastomosis is unremarkable. No obstruction with enteric contrast reaching the colon. Mild colonic wall thickening involving the cecum and ascending colon may be portal enteropathy. Small to moderate stool throughout the remainder the colon. The appendix is normal. Vascular/Lymphatic: No retroperitoneal adenopathy. Abdominal aorta is normal in caliber. No evidence of portal venous thrombus. Splenic vein is patent. Reproductive: Prostate gland is normal in size, mildly heterogeneous. Bladder: Distended. Small focus of air may be related to instrumentation. Other: Moderate volume intra-abdominal ascites. No free air. No loculated abscess. There is mesenteric edema. Whole body wall edema is most prominent in the flanks. Small umbilical hernia contains fat and ascites. Musculoskeletal: There are no acute or suspicious osseous abnormalities. Chronic change about both hips. Degenerative change in the spine. IMPRESSION: 1. Hepatomegaly with cirrhotic hepatic morphology and decreased density and, steatosis versus chronic liver disease. Portal hypertension with splenomegaly and esophageal varices. Wall thickening of the cecum and ascending colon may be portal enteropathy. Moderate volume of intra-abdominal ascites. 2. Cholelithiasis. 3. Post gastric bypass. Small gastric pouch with enteric sutures located above the diaphragm. Wall thickening of the distal esophagus. Electronically Signed  By: Rubye Oaks M.D.   On: 08/26/2015 23:38       Assessment/Plan:  INTERVAL HISTORY:   08/26/15: AMp S enterococcus, E coli and klebsiella growing from cultures 08/27/2015: CT scan failed to show any intra-abdominal abscess  Principal Problem:   Bleeding gastrointestinal Active Problems:   Alcoholism (HCC)   Depression   Diabetes mellitus without complication (HCC)   Hypercholesterolemia   Neuropathy (HCC)   Elevated lactic acid level   Thrombocytopenia (HCC)   Hemorrhagic shock   Acute respiratory failure (HCC)   Portal hypertension with esophageal varices (HCC)   Alcohol intoxication (HCC)   Elevated liver enzymes   Uncontrolled type 2 diabetes mellitus with ketoacidosis without coma, without long-term current use of insulin (HCC)   Polymicrobial bacterial infection   Bacteremia   Mallory-Weiss tear    Bradley Harris is a 60 y.o. male with  Etohism, hx of gastric bypass, esophageal varices, admitted with GIB sp EGD with esophageal varices and MW tear found, also with polymicrobial bacteremia with enterococcus, E coli and klebsiella isolated --though only from 1/2 blood culture sites  #1 Polymicrobial bacteremia  continue unasyn while an inpatient and then change to oral Augmentin to complete 2 week course of antibiotics in total  I will otherwise sign off for now.     LOS: 5 days   Acey Lav 08/27/2015, 1:08 PM

## 2015-08-27 NOTE — Progress Notes (Signed)
PROGRESS NOTE                                                                                                                                                                                                             Patient Demographics:    Bradley Harris, is a 60 y.o. male, DOB - November 15, 1955, ZOX:[id number]  Admit date - 08/22/2015   Admitting Physician Chilton Greathouse, MD  Outpatient Primary MD for the patient is No PCP Per Patient  LOS - 5  Outpatient Specialists:none  Chief Complaint  Patient presents with  . GI Bleeding       Brief Narrative  60 year old alcoholic male (drinks one bottle of bourbon daily), with cirrhosis (ascites and portal hypertension), history of diabetes mellitus, hyperlipidemia and depression, gastric bypass surgery, admitted to the hospitalist service on 7/28 with several episodes of hematemesis and melena for 2 days. He is visiting his girlfriend from [place]. In the ED he was tachycardic and hypotensive which improved with 2 L normal saline bolus. His hemoglobin was 8.1 with lactate of 8 and platelets of 73. Alcohol level was 316, INR is 1.36 with transaminitis. Patient admitted to stepdown unit and [facility] consulted. Patient was ordered for PRBC transfusion but soon after he was started he got up and became tachycardic and hypotensive, possible large amount of dark stool. Patient transferred to ICU with hemorrhagic shock and placed on Levophed drip and intubated for airway protection. Patient had urgent EGD done showing bleeding Mallory-Weiss tear and hemostasis achieved with  Cautery. Also showed nonbleeding esophageal varices. Patient continued on octreotide and Protonix drip. Blood cultures on admission growing Escherichia coli, Klebsiella and enterococcus.   SIGNIFICANT EVENTS: 7/28 >>  Admitted to SDU  For active upper GI bleed, hypotension-->> transferred to ICU for hemorrhagic  shock 7/28 >> intubated to secure airway for EGD; EGD showing bleeding Mallory-Weiss tear and underwent cautery. Managed in the ICU on pressors. Polymicrobial bacteremia on blood cultures. 7/29 >> off pressors 7/30 >> Extubated 7/31>> transfer to stepdown on the hospitalist service.  LINES/TUBES: PIV 7/28 >  ETT 7/28 > 7/30 CVC R IJ 7/28 >  Foley 7/28 >     Subjective:   Patient reports passing some dark stools overnight. Denies any headache or dizziness or vomiting.   Assessment  & Plan :   Hemorrhagic shock secondary  to bleeding Mallory-Weiss tear - Required ICU admission with pressors and intubation for airway protection. - EGD with cauterization of the bleeding tear. Off octreotide and PPI drip. Continue twice a day IV Protonix. - Hemodynamically stable but still having some melena. H&H stable after 6 unit PRBC. Seen by surgery and recommend angioembolization if he has persistent GI bleed. - Continue step down monitoring. - Monitor H&H closely.  Lactic acidosis - Secondary to shock and? Bacteremia. Now resolved.  Polymicrobial bacteremia - Blood culture 1/2 growing enterococcus, Escherichia coli and Klebsiella. ID consult appreciated, continue with IV Unasyn , CT abdomen and pelvis with no evidence of intra-abdominal abscess, only significant for moderate amount of ascites, abdominal exam is benign  Liver cirrhosis - Secondary to alcohol abuse, negative hepatitis panel, with evidence of portal hypertension and esophageal varices, will start on Nadolol, will add Aldactone and low dose Lasix when more stable.  Alcoholism (HCC) - Has underlying portal hypertension with nonbleeding esophageal varices. Has shock liver secondary to primary insult. Chronic thrombocytopenia. - LFTs slowly trending down. No signs of acute alcohol withdrawal at this time (although patient does get anxious).  - Liver ultrasound significant for cirrhotic liver and cholelithiasis. Hepatitis panel  negative. - Continue thiamine, folate and multivitamin. - Counseled on alcohol cessation.  Thrombocytopenia - Secondary to underlying cirrhosis and shock. Monitor.    Hyperkalemia/hypomagnesemia - Resolved/replenished  Diabetes mellitus type 2 - Monitor on sliding scale coverage. On NovoLog at home.  Depression - Continue risperidone and Celexa.   Code Status : Full code  Family Communication  : None at bedside  Disposition Plan  :  Likely Home Pending clinical improvement  Consults  :   PCCM [facility] ID    DVT Prophylaxis  : SCDs  Lab Results  Component Value Date   PLT 44 (L) 08/27/2015    Antibiotics  :    Anti-infectives    Start     Dose/Rate Route Frequency Ordered Stop   08/26/15 0030  Ampicillin-Sulbactam (UNASYN) 3 g in sodium chloride 0.9 % 100 mL IVPB     3 g 100 mL/hr over 60 Minutes Intravenous Every 6 hours 08/26/15 0016     08/25/15 1000  cefTRIAXone (ROCEPHIN) 2 g in dextrose 5 % 50 mL IVPB  Status:  Discontinued     2 g 100 mL/hr over 30 Minutes Intravenous Every 24 hours 08/25/15 0914 08/26/15 0008   08/23/15 2200  cefTRIAXone (ROCEPHIN) 2 g in dextrose 5 % 50 mL IVPB  Status:  Discontinued     2 g 100 mL/hr over 30 Minutes Intravenous Every 24 hours 08/22/15 0658 08/23/15 1407   08/23/15 2130  piperacillin-tazobactam (ZOSYN) IVPB 3.375 g  Status:  Discontinued     3.375 g 12.5 mL/hr over 240 Minutes Intravenous Every 8 hours 08/23/15 1448 08/25/15 0914   08/23/15 1530  piperacillin-tazobactam (ZOSYN) IVPB 3.375 g     3.375 g 100 mL/hr over 30 Minutes Intravenous  Once 08/23/15 1448 08/23/15 1638   08/23/15 0800  vancomycin (VANCOCIN) IVPB 1000 mg/200 mL premix  Status:  Discontinued     1,000 mg 200 mL/hr over 60 Minutes Intravenous Every 8 hours 08/22/15 2317 08/23/15 1407   08/22/15 2330  cefTRIAXone (ROCEPHIN) 2 g in dextrose 5 % 50 mL IVPB     2 g 100 mL/hr over 30 Minutes Intravenous STAT 08/22/15 2317 08/23/15 0040    08/22/15 2330  vancomycin (VANCOCIN) 2,000 mg in sodium chloride 0.9 % 500 mL IVPB  2,000 mg 250 mL/hr over 120 Minutes Intravenous  Once 08/22/15 2317 08/23/15 0230   08/22/15 0700  cefTRIAXone (ROCEPHIN) 2 g in dextrose 5 % 50 mL IVPB  Status:  Discontinued     2 g 100 mL/hr over 30 Minutes Intravenous NOW 08/22/15 0658 08/22/15 1841        Objective:   Vitals:   08/27/15 0200 08/27/15 0337 08/27/15 0500 08/27/15 0807  BP: 104/70 115/71  116/70  Pulse: 74 81    Resp: 12 (!) 21    Temp:  98.2 F (36.8 C)  98.8 F (37.1 C)  TempSrc:  Oral  Oral  SpO2: 98% 100%    Weight:   114.1 kg (251 lb 8.7 oz)   Height:        Wt Readings from Last 3 Encounters:  08/27/15 114.1 kg (251 lb 8.7 oz)     Intake/Output Summary (Last 24 hours) at 08/27/15 1116 Last data filed at 08/27/15 1057  Gross per 24 hour  Intake              863 ml  Output              325 ml  Net              538 ml     Physical Exam  Gen: not in distress HEENT: Pallor present, moist mucosa, supple neck Chest: clear b/l, no added sounds CVS: N S1&S2, no murmurs, rubs or gallop GI: soft, NT, ND, BS+ Musculoskeletal: warm, no edema CNS: AAOX3, non focal    Data Review:    CBC  Recent Labs Lab 08/22/15 0147  08/22/15 0742  08/25/15 0440 08/25/15 1843 08/26/15 0624 08/26/15 1419 08/26/15 1637 08/27/15 0516  WBC 4.3  < > 4.5  < > 2.8* 3.7* 2.2* 1.0*  --  1.7*  HGB 8.1*  < > 5.8*  < > 7.6* 8.3* 8.3* 8.0*  --  8.6*  HCT 25.7*  < > 19.3*  < > 24.4* 25.0* 26.1* 24.4*  --  26.4*  PLT 73*  < > 61*  < > 29* PLATELET CLUMPS NOTED ON SMEAR, UNABLE TO ESTIMATE 34* 39*  --  44*  MCV 86.5  < > 88.9  < > 88.1 85.3 87.3 87.1  --  86.8  MCH 27.3  < > 26.7  < > 27.4 28.3 27.8 28.6  --  28.3  MCHC 31.5  < > 30.1  < > 31.1 33.2 31.8 32.8  --  32.6  RDW 17.5*  < > 17.7*  < > 16.8* 16.5* 16.3* 16.9*  --  16.5*  LYMPHSABS 1.2  --  0.5*  --   --   --   --   --  0.6*  --   MONOABS 0.3  --  0.5  --   --   --   --    --  0.5  --   EOSABS 0.0  --  0.0  --   --   --   --   --  0.0  --   BASOSABS 0.0  --  0.0  --   --   --   --   --  0.0  --   < > = values in this interval not displayed.  Chemistries   Recent Labs Lab 08/22/15 1610  08/23/15 0259 08/23/15 0950 08/24/15 0350 08/24/15 1746 08/25/15 0440 08/26/15 0624 08/27/15 0516  NA 144  < > 143  --  145  --  144 139 139  K 5.6*  < > 4.6  --  3.7  --  3.3* 3.6 3.5  CL 110  < > 107  --  110  --  108 103 104  CO2 12*  < > 20*  --  29  --  28 31 29   GLUCOSE 163*  < > 201*  --  197*  --  163* 224* 195*  BUN 17  < > 25*  --  29*  --  20 8 5*  CREATININE 1.02  < > 1.60*  --  0.98  --  0.83 0.72 0.70  CALCIUM 7.2*  < > 6.3*  --  6.7*  --  6.8* 7.6* 7.9*  MG 1.5*  --   --  1.0*  --  1.3* 1.7  --   --   AST 763*  --  1,884*  --  594*  --  372* 203* 128*  ALT 282*  --  531*  --  352*  --  314* 256* 189*  ALKPHOS 84  --  64  --  47  --  48 52 67  BILITOT 2.6*  --  4.2*  --  5.0*  --  5.1* 5.0* 5.9*  < > = values in this interval not displayed. ------------------------------------------------------------------------------------------------------------------ No results for input(s): CHOL, HDL, LDLCALC, TRIG, CHOLHDL, LDLDIRECT in the last 72 hours.  Lab Results  Component Value Date   HGBA1C 6.3 (H) 08/26/2015   ------------------------------------------------------------------------------------------------------------------ No results for input(s): TSH, T4TOTAL, T3FREE, THYROIDAB in the last 72 hours.  Invalid input(s): FREET3 ------------------------------------------------------------------------------------------------------------------ No results for input(s): VITAMINB12, FOLATE, FERRITIN, TIBC, IRON, RETICCTPCT in the last 72 hours.  Coagulation profile  Recent Labs Lab 08/23/15 0259 08/24/15 1746 08/25/15 0440 08/26/15 0624 08/27/15 0516  INR 2.11 1.87 1.77 1.64 1.48    No results for input(s): DDIMER in the last 72  hours.  Cardiac Enzymes  Recent Labs Lab 08/22/15 0742  TROPONINI <0.03   ------------------------------------------------------------------------------------------------------------------    Component Value Date/Time   BNP 14.7 08/22/2015 0742    Inpatient Medications  Scheduled Meds: . ampicillin-sulbactam (UNASYN) IV  3 g Intravenous Q6H  . busPIRone  5 mg Oral BID  . citalopram  20 mg Oral Daily  . folic acid  1 mg Oral Daily  . insulin aspart  0-5 Units Subcutaneous QHS  . insulin aspart  0-9 Units Subcutaneous TID WC  . pantoprazole  40 mg Intravenous Q12H  . sodium chloride flush  3 mL Intravenous Q12H  . thiamine  100 mg Oral Daily   Continuous Infusions:  PRN Meds:.sodium chloride, ALPRAZolam, [DISCONTINUED] ondansetron **OR** ondansetron (ZOFRAN) IV  Micro Results Recent Results (from the past 240 hour(s))  Culture, blood (routine x 2)     Status: Abnormal   Collection Time: 08/22/15  7:45 AM  Result Value Ref Range Status   Specimen Description BLOOD LEFT ANTECUBITAL  Final   Special Requests BOTTLES DRAWN AEROBIC AND ANAEROBIC 5CC  Final   Culture  Setup Time   Final    GRAM NEGATIVE RODS GRAM POSITIVE COCCI IN PAIRS AND CHAINS IN BOTH AEROBIC AND ANAEROBIC BOTTLES CRITICAL RESULT CALLED TO, READ BACK BY AND VERIFIED WITHMerlene Morse PHARMD 2254 08/22/15 A BROWNING    Culture (A)  Final    ESCHERICHIA COLI KLEBSIELLA PNEUMONIAE ENTEROCOCCUS SPECIES    Report Status 08/25/2015 FINAL  Final   Organism ID, Bacteria ESCHERICHIA COLI  Final   Organism ID, Bacteria KLEBSIELLA PNEUMONIAE  Final  Organism ID, Bacteria ENTEROCOCCUS SPECIES  Final      Susceptibility   Escherichia coli - MIC*    AMPICILLIN <=2 SENSITIVE Sensitive     CEFAZOLIN <=4 SENSITIVE Sensitive     CEFEPIME <=1 SENSITIVE Sensitive     CEFTAZIDIME <=1 SENSITIVE Sensitive     CEFTRIAXONE <=1 SENSITIVE Sensitive     CIPROFLOXACIN <=0.25 SENSITIVE Sensitive     GENTAMICIN <=1 SENSITIVE  Sensitive     IMIPENEM <=0.25 SENSITIVE Sensitive     TRIMETH/SULFA <=20 SENSITIVE Sensitive     AMPICILLIN/SULBACTAM <=2 SENSITIVE Sensitive     PIP/TAZO <=4 SENSITIVE Sensitive     * ESCHERICHIA COLI   Enterococcus species - MIC*    AMPICILLIN <=2 SENSITIVE Sensitive     VANCOMYCIN 2 SENSITIVE Sensitive     GENTAMICIN SYNERGY SENSITIVE Sensitive     LINEZOLID 2 SENSITIVE Sensitive     * ENTEROCOCCUS SPECIES   Klebsiella pneumoniae - MIC*    AMPICILLIN >=32 RESISTANT Resistant     CEFAZOLIN <=4 SENSITIVE Sensitive     CEFEPIME <=1 SENSITIVE Sensitive     CEFTAZIDIME <=1 SENSITIVE Sensitive     CEFTRIAXONE <=1 SENSITIVE Sensitive     CIPROFLOXACIN <=0.25 SENSITIVE Sensitive     GENTAMICIN <=1 SENSITIVE Sensitive     IMIPENEM <=0.25 SENSITIVE Sensitive     TRIMETH/SULFA <=20 SENSITIVE Sensitive     AMPICILLIN/SULBACTAM 4 SENSITIVE Sensitive     PIP/TAZO <=4 SENSITIVE Sensitive     * KLEBSIELLA PNEUMONIAE  Blood Culture ID Panel (Reflexed)     Status: Abnormal   Collection Time: 08/22/15  7:45 AM  Result Value Ref Range Status   Enterococcus species DETECTED (A) NOT DETECTED Final    Comment: CRITICAL RESULT CALLED TO, READ BACK BY AND VERIFIED WITH: V BRYK PHARMD 2254 08/22/15 A BROWNING    Vancomycin resistance NOT DETECTED NOT DETECTED Final   Listeria monocytogenes NOT DETECTED NOT DETECTED Final   Staphylococcus species NOT DETECTED NOT DETECTED Final   Staphylococcus aureus NOT DETECTED NOT DETECTED Final   Methicillin resistance NOT DETECTED NOT DETECTED Final   Streptococcus species NOT DETECTED NOT DETECTED Final   Streptococcus agalactiae NOT DETECTED NOT DETECTED Final   Streptococcus pneumoniae NOT DETECTED NOT DETECTED Final   Streptococcus pyogenes NOT DETECTED NOT DETECTED Final   Acinetobacter baumannii NOT DETECTED NOT DETECTED Final   Enterobacteriaceae species DETECTED (A) NOT DETECTED Final    Comment: CRITICAL RESULT CALLED TO, READ BACK BY AND VERIFIED  WITH: V BRYK PHARMD 2254 08/22/15 A BROWNING    Enterobacter cloacae complex NOT DETECTED NOT DETECTED Final   Escherichia coli DETECTED (A) NOT DETECTED Final    Comment: CRITICAL RESULT CALLED TO, READ BACK BY AND VERIFIED WITH: V BRYK PHARMD 2254 08/22/15 A BROWNING    Klebsiella oxytoca NOT DETECTED NOT DETECTED Final   Klebsiella pneumoniae DETECTED (A) NOT DETECTED Final    Comment: CRITICAL RESULT CALLED TO, READ BACK BY AND VERIFIED WITH: V BRYK PHARMD 2254 08/22/15 A BROWNING    Proteus species NOT DETECTED NOT DETECTED Final   Serratia marcescens NOT DETECTED NOT DETECTED Final   Carbapenem resistance NOT DETECTED NOT DETECTED Final   Haemophilus influenzae NOT DETECTED NOT DETECTED Final   Neisseria meningitidis NOT DETECTED NOT DETECTED Final   Pseudomonas aeruginosa NOT DETECTED NOT DETECTED Final   Candida albicans NOT DETECTED NOT DETECTED Final   Candida glabrata NOT DETECTED NOT DETECTED Final   Candida krusei NOT DETECTED NOT DETECTED Final  Candida parapsilosis NOT DETECTED NOT DETECTED Final   Candida tropicalis NOT DETECTED NOT DETECTED Final  Culture, blood (routine x 2)     Status: None (Preliminary result)   Collection Time: 08/22/15  7:53 AM  Result Value Ref Range Status   Specimen Description BLOOD LEFT HAND  Final   Special Requests BOTTLES DRAWN AEROBIC ONLY 8CC  Final   Culture NO GROWTH 4 DAYS  Final   Report Status PENDING  Incomplete  MRSA PCR Screening     Status: None   Collection Time: 08/22/15  1:20 PM  Result Value Ref Range Status   MRSA by PCR NEGATIVE NEGATIVE Final    Comment:        The GeneXpert MRSA Assay (FDA approved for NASAL specimens only), is one component of a comprehensive MRSA colonization surveillance program. It is not intended to diagnose MRSA infection nor to guide or monitor treatment for MRSA infections.   Culture, Urine     Status: None   Collection Time: 08/25/15 10:31 AM  Result Value Ref Range Status    Specimen Description URINE, CATHETERIZED  Final   Special Requests NONE  Final   Culture NO GROWTH  Final   Report Status 08/26/2015 FINAL  Final    Radiology Reports US Abdomen Complete  Result Date: 08/26/2015 CLINICAL DATA:  60 year old male with abnormal LFTs. Initial encounter. EXAM: ABDOMEN ULTRASOUND COMPLETE COMPARISON:  CT Abdomen and Pelvis 2150 hours today, reported separately. FINDINGS: Gallbladder: Small shadowing stones in the neck of the gallbladder (image 4). Estimated individual calculus size 7 mm. Gallbladder wall thickness remains normal. No sonographic Murphy sign elicited. Common bile duct: Diameter: 5 mm, normal Liver: Echogenic. Nodular contour (image 22). No discrete liver lesion. No intrahepatic biliary ductal dilatation. IVC: No abnormality visualized. Pancreas: Could not be visualized due to overlying bowel gas. Spleen: Splenomegaly. Estimated splenic volume 1096 mL (normal splenic volume range 83 - 412 mL). No discrete splenic lesion. Right Kidney: Length: 12.9 cm. Echogenicity within normal limits. No mass or hydronephrosis visualized. Left Kidney: Length: 13 cm. Echogenicity within normal limits. No mass or hydronephrosis visualized. Abdominal aorta: No aneurysm visualized. Other findings: Small volume ascites in the upper abdomen. IMPRESSION: 1. Hepatic steatosis with nodular liver contour suggesting Cirrhosis. 2. Splenomegaly and ascites suggesting portal hypertension. 3. Cholelithiasis without strong evidence of acute cholecystitis. No evidence of biliary obstruction. 4. See also CT Abdomen and Pelvis from today reported separately. Electronically Signed   By: Odessa Fleming M.D.   On: 08/26/2015 23:33   Ct Abdomen Pelvis W Contrast  Result Date: 08/26/2015 CLINICAL DATA:  Sepsis. GI bleed, Mallory-Weiss tear with cautery 4 days prior. EXAM: CT ABDOMEN AND PELVIS WITH CONTRAST TECHNIQUE: Multidetector CT imaging of the abdomen and pelvis was performed using the standard protocol  following bolus administration of intravenous contrast. CONTRAST:  ISOVUE-300 IOPAMIDOL (ISOVUE-300) INJECTION 61% COMPARISON:  No prior abdominal imaging. FINDINGS: Lower chest: Small bilateral pleural effusions with adjacent atelectasis, right greater than left. Coronary artery calcifications are seen. Liver: Enlarged measuring 23 cm cranial caudal. Diffusely decreased density. No discrete focal hepatic lesion. There are nodular hepatic contours. Hepatobiliary: Calcified gallstones in the dependent gallbladder. No abnormal gallbladder distention. No biliary dilatation. Pancreas: No ductal dilatation or inflammation. Spleen: Enlarged measuring 8.6 x 14.4 x 19.7 cm (volume = 1268.6 cc). Small splenic cleft. Adrenal glands: No nodule. Kidneys: Symmetric renal enhancement and excretion. No hydronephrosis. No perinephric edema. Stomach/Bowel: Post gastric bypass. The gastric pouch appears small with enteric  chain sutures located above the diaphragm. There is thickening of the distal esophagus. Esophageal varices are noted. Excluded gastric remnant is decompressed. The jejunal-jejunal anastomosis is unremarkable. No obstruction with enteric contrast reaching the colon. Mild colonic wall thickening involving the cecum and ascending colon may be portal enteropathy. Small to moderate stool throughout the remainder the colon. The appendix is normal. Vascular/Lymphatic: No retroperitoneal adenopathy. Abdominal aorta is normal in caliber. No evidence of portal venous thrombus. Splenic vein is patent. Reproductive: Prostate gland is normal in size, mildly heterogeneous. Bladder: Distended. Small focus of air may be related to instrumentation. Other: Moderate volume intra-abdominal ascites. No free air. No loculated abscess. There is mesenteric edema. Whole body wall edema is most prominent in the flanks. Small umbilical hernia contains fat and ascites. Musculoskeletal: There are no acute or suspicious osseous  abnormalities. Chronic change about both hips. Degenerative change in the spine. IMPRESSION: 1. Hepatomegaly with cirrhotic hepatic morphology and decreased density and, steatosis versus chronic liver disease. Portal hypertension with splenomegaly and esophageal varices. Wall thickening of the cecum and ascending colon may be portal enteropathy. Moderate volume of intra-abdominal ascites. 2. Cholelithiasis. 3. Post gastric bypass. Small gastric pouch with enteric sutures located above the diaphragm. Wall thickening of the distal esophagus. Electronically Signed   By: Rubye Oaks M.D.   On: 08/26/2015 23:38   Dg Chest Port 1 View  Result Date: 08/24/2015 CLINICAL DATA:  Fluid overload EXAM: PORTABLE CHEST 1 VIEW COMPARISON:  08/23/2015 FINDINGS: Endotracheal tube and right dialysis catheter are unchanged. Improving aeration. No confluent airspace opacities, effusions or edema. Heart is normal size. No acute bony abnormality. IMPRESSION: Stable support devices. No acute findings. Electronically Signed   By: Charlett Nose M.D.   On: 08/24/2015 08:15  Dg Chest Port 1 View  Result Date: 08/23/2015 CLINICAL DATA:  Fluid overload. EXAM: PORTABLE CHEST 1 VIEW COMPARISON:  One-view chest 08/22/2015 FINDINGS: The heart size is exaggerated by low lung volumes. Endotracheal tube and right IJ line are stable. Aeration is slightly improved. IMPRESSION: Improving aeration with persistent low lung volumes. Electronically Signed   By: Marin Roberts M.D.   On: 08/23/2015 12:28  Dg Chest Port 1 View  Result Date: 08/22/2015 CLINICAL DATA:  Status post intubation and central line placement today. EXAM: PORTABLE CHEST 1 VIEW COMPARISON:  Single-view of the chest earlier today. FINDINGS: Endotracheal tube is in place with the tip in good position 4 cm above the carina. Right IJ catheter tip projects in the lower superior vena cava. Negative for pneumothorax. Lungs are clear. Heart size is normal. No pleural  effusion. IMPRESSION: ETT and right IJ catheter projecting good position. Negative for pneumothorax or acute disease. Electronically Signed   By: Drusilla Kanner M.D.   On: 08/22/2015 11:31  Dg Chest Port 1 View  Result Date: 08/22/2015 CLINICAL DATA:  Acute respiratory failure. EXAM: PORTABLE CHEST 1 VIEW COMPARISON:  None. FINDINGS: The heart size and mediastinal contours are within normal limits. Both lungs are clear. The visualized skeletal structures are unremarkable. IMPRESSION: No active disease. Electronically Signed   By: Ted Mcalpine M.D.   On: 08/22/2015 07:38    Mackenzy Eisenberg M.D on 08/27/2015 at 11:16 AM  Between 7am to 7pm - Pager - [phone]  After 7pm go to [email or web address] - password [facility]  [facility] -  Office  [phone]

## 2015-08-27 NOTE — Plan of Care (Signed)
Problem: Education: Goal: Knowledge of [facility] Education information/materials will improve Outcome: Progressing Patient oriented to unit and how to use call light/phone/ contact staff for assistance. Patient given admission patient guide and given updated information about his current status and what his plan of care will be. Patient aware of course of antibiotics and plans to assess him for increased bleeding as well as his HGB. Agreeable to this.   Problem: Safety: Goal: Ability to remain free from injury will improve Outcome: Progressing Patient knows to call and ask for assistance when transferring out of bed to keep from falling. Successfully calls each time he wants to get up.   Problem: Health Behavior/Discharge Planning: Goal: Ability to manage health-related needs will improve Outcome: Progressing Patient involved in care and aware of plan for discharge home once stable. PT OT worked with patient and encouraged him to be active as much as possible in room.  Problem: Pain Managment: Goal: General experience of comfort will improve Outcome: Progressing Patient did not experience any pain/discomfort during my shift. Assessed every 2 hours and given opportunity to let me know if his status changes.  Problem: Physical Regulation: Goal: Ability to maintain clinical measurements within normal limits will improve Outcome: Progressing Patient vitals,l and O and lab results being monitored to assess for changes with any of these things as a marker of improvement. Goal: Will remain free from infection Outcome: Progressing Patient currently on antibiotics to treat known infection.   Problem: Skin Integrity: Goal: Risk for impaired skin integrity will decrease Outcome: Progressing Patient encouraged to turn self in bed regularly. Also encouraged to start being more active once able. Skin intact with no signs of breakdown.  Problem: Tissue Perfusion: Goal: Risk factors for  ineffective tissue perfusion will decrease Outcome: Progressing Patient refuses to use SCD's, educated and told to be active to counteract any likelihood of VTE.  Problem: Activity: Goal: Risk for activity intolerance will decrease Outcome: Progressing PT and OT consulted with patient today and encouraged him to perform exercises and get up to bathroom to improve strength and ability to function independently once he discharges.  Problem: Fluid Volume: Goal: Ability to maintain a balanced intake and output will improve Outcome: Progressing Closely monitoring I and O. Patient has good appetite and appropriate output.   Problem: Nutrition: Goal: Adequate nutrition will be maintained Outcome: Progressing Patient with good appetite.  Problem: Bowel/Gastric: Goal: Will not experience complications related to bowel motility Outcome: Progressing Patient BM's decreasing in frequency and becoming less grossly bloody.

## 2015-08-28 DIAGNOSIS — K284 Chronic or unspecified gastrojejunal ulcer with hemorrhage: Secondary | ICD-10-CM

## 2015-08-28 LAB — PROTIME-INR
INR: 1.5
PROTHROMBIN TIME: 18.3 s — AB (ref 11.4–15.2)

## 2015-08-28 LAB — COMPREHENSIVE METABOLIC PANEL
ALBUMIN: 2.4 g/dL — AB (ref 3.5–5.0)
ALT: 148 U/L — ABNORMAL HIGH (ref 17–63)
ANION GAP: 6 (ref 5–15)
AST: 114 U/L — ABNORMAL HIGH (ref 15–41)
Alkaline Phosphatase: 79 U/L (ref 38–126)
BILIRUBIN TOTAL: 7 mg/dL — AB (ref 0.3–1.2)
BUN: <5 mg/dL — ABNORMAL LOW (ref 6–20)
CO2: 29 mmol/L (ref 22–32)
Calcium: 7.9 mg/dL — ABNORMAL LOW (ref 8.9–10.3)
Chloride: 105 mmol/L (ref 101–111)
Creatinine, Ser: 0.64 mg/dL (ref 0.61–1.24)
GFR calc Af Amer: >60 mL/min (ref >60)
GFR calc non Af Amer: >60 mL/min (ref >60)
GLUCOSE: 174 mg/dL — AB (ref 65–99)
POTASSIUM: 3.2 mmol/L — AB (ref 3.5–5.1)
SODIUM: 140 mmol/L (ref 135–145)
TOTAL PROTEIN: 5.1 g/dL — AB (ref 6.5–8.1)

## 2015-08-28 LAB — CBC
HEMATOCRIT: 27.2 % — AB (ref 39.0–52.0)
Hemoglobin: 8.5 g/dL — ABNORMAL LOW (ref 13.0–17.0)
MCH: 27.7 pg (ref 26.0–34.0)
MCHC: 31.3 g/dL (ref 30.0–36.0)
MCV: 88.6 fL (ref 78.0–100.0)
Platelets: 54 10*3/uL — ABNORMAL LOW (ref 150–400)
RBC: 3.07 MIL/uL — ABNORMAL LOW (ref 4.22–5.81)
RDW: 17.7 % — AB (ref 11.5–15.5)
WBC: 1.2 10*3/uL — CL (ref 4.0–10.5)

## 2015-08-28 LAB — GLUCOSE, CAPILLARY
GLUCOSE-CAPILLARY: 195 mg/dL — AB (ref 65–99)
GLUCOSE-CAPILLARY: 229 mg/dL — AB (ref 65–99)
Glucose-Capillary: 166 mg/dL — ABNORMAL HIGH (ref 65–99)
Glucose-Capillary: 181 mg/dL — ABNORMAL HIGH (ref 65–99)

## 2015-08-28 MED ORDER — SPIRONOLACTONE 25 MG PO TABS
12.5000 mg | ORAL_TABLET | Freq: Every day | ORAL | Status: DC
  Administered 2015-08-28 – 2015-08-29 (×3): 12.5 mg via ORAL
  Filled 2015-08-28 (×2): qty 1

## 2015-08-28 MED ORDER — POTASSIUM CHLORIDE CRYS ER 20 MEQ PO TBCR
40.0000 meq | EXTENDED_RELEASE_TABLET | Freq: Two times a day (BID) | ORAL | Status: AC
  Administered 2015-08-28 (×2): 40 meq via ORAL
  Filled 2015-08-28 (×2): qty 2

## 2015-08-28 MED ORDER — NADOLOL 20 MG PO TABS
20.0000 mg | ORAL_TABLET | Freq: Two times a day (BID) | ORAL | Status: DC
  Filled 2015-08-28 (×3): qty 1

## 2015-08-28 NOTE — Progress Notes (Signed)
Admission note:   Arrival Method: Via w/c from [facility]. Mental Status: A&Ox4.  Telemetry: N/A.  Skin: Skin tear on bilateral forearms and covered with foam. Black scab on right great toe, right 2nd toe, and right lateral ankle. Serous filled blisters and blanchable redness noted on buttocks/sacrum -  Foam dressing applied.   Tubes: N/A. IV: LFA, dressing reinforced. Pain: Denies.  Family: Girlfriend at bedside. Living Situation: Visiting from [place]. Safety Measures: Placed in a camera room. Instructed to call for assistance before getting out of bed. Patient declined to have bed alarm on.  6E Orientation: Oriented to unit and surroundings. Educated on using phone numbers to call RN and NT.   Leanna Battles, RN.

## 2015-08-28 NOTE — Progress Notes (Signed)
Nutrition Follow Up  DOCUMENTATION CODES:   Obesity unspecified  INTERVENTION:    Continue Soft diet  NUTRITION DIAGNOSIS:   Inadequate oral intake related to inability to eat as evidenced by NPO status, resolved  GOAL:   Patient will meet greater than or equal to 90% of their needs, met  MONITOR:   PO intake, Labs, Weight trends, I & O's  ASSESSMENT:   60 year old male, who presented to the ED on 08/22/15 with bloody emesis x 12 with some bright red blood, dark stool with clots, light headed and generalized weakness. Hx of gastric bypass surgery.  Pt extubated 7/30. Advanced to Full Liquids, then Soft diet. PO intake good at mostly 100% per flowsheet records.  Diet Order:  DIET SOFT Room service appropriate? Yes; Fluid consistency: Thin  Skin:  Reviewed, no issues  Last BM:  8/3  Height:   Ht Readings from Last 1 Encounters:  08/22/15 6' 4" (1.93 m)    Weight:   Wt Readings from Last 1 Encounters:  08/28/15 249 lb 1.9 oz (113 kg)    Ideal Body Weight:  91.8 kg  BMI:  Body mass index is 30.32 kg/m.  Estimated Nutritional Needs:   Kcal:  2271  Protein:  140-160 gm  Fluid:  2.3 L  EDUCATION NEEDS:   No education needs identified at this time  Katie Lamberton, RD, LDN Pager #: [phone] After-Hours Pager #: [phone]   

## 2015-08-28 NOTE — Progress Notes (Signed)
PROGRESS NOTE                                                                                                                                                                                                             Patient Demographics:    Bradley Harris, is a 60 y.o. male, DOB - Jun 08, 1955, YNW:[id number]  Admit date - 08/22/2015   Admitting Physician Chilton Greathouse, MD  Outpatient Primary MD for the patient is No PCP Per Patient  LOS - 6  Outpatient Specialists:none  Chief Complaint  Patient presents with  . GI Bleeding       Brief Narrative  60 year old alcoholic male (drinks one bottle of bourbon daily), with cirrhosis (ascites and portal hypertension), history of diabetes mellitus, hyperlipidemia and depression, gastric bypass surgery, admitted to the hospitalist service on 7/28 with several episodes of hematemesis and melena for 2 days. He is visiting his girlfriend from [place]. In the ED he was tachycardic and hypotensive which improved with 2 L normal saline bolus. His hemoglobin was 8.1 with lactate of 8 and platelets of 73. Alcohol level was 316, INR is 1.36 with transaminitis. Patient admitted to stepdown unit and [facility] consulted. Patient was ordered for PRBC transfusion but soon after he was started he got up and became tachycardic and hypotensive, possible large amount of dark stool. Patient transferred to ICU with hemorrhagic shock and placed on Levophed drip and intubated for airway protection. Patient had urgent EGD done showing bleeding Mallory-Weiss tear and hemostasis achieved with  Cautery. Also showed nonbleeding esophageal varices. Patient continued on octreotide and Protonix drip. Blood cultures on admission growing Escherichia coli, Klebsiella and enterococcus.   SIGNIFICANT EVENTS: 7/28 >>  Admitted to SDU  For active upper GI bleed, hypotension-->> transferred to ICU for hemorrhagic  shock 7/28 >> intubated to secure airway for EGD; EGD showing bleeding Mallory-Weiss tear and underwent cautery. Managed in the ICU on pressors. Polymicrobial bacteremia on blood cultures. 7/29 >> off pressors 7/30 >> Extubated 7/31>> transfer to stepdown on the hospitalist service.  LINES/TUBES: PIV 7/28 >  ETT 7/28 > 7/30 CVC R IJ 7/28 >  Foley 7/28 >     Subjective:   Patient reports Diarrhea, no blood in the stool. Denies any headache or dizziness or vomiting.   Assessment  & Plan :   Hemorrhagic shock  secondary to bleeding Mallory-Weiss tear - Required ICU admission with pressors and intubation for airway protection. - EGD with cauterization of the bleeding tear. Off octreotide and PPI drip. Continue twice a day IV Protonix. - Hemodynamically stable but still having some melena. H&H stable after 6 unit PRBC. Seen by surgery and recommend angioembolization if he has persistent GI bleed. - Hemoglobin seems to be stable over the last few days, will transfer to medical floor, will advance to soft diet as discussed with Dr. Ewing Schlein.  Lactic acidosis - Secondary to shock and? Bacteremia. Now resolved.  Polymicrobial bacteremia - Blood culture 1/2 growing enterococcus, Escherichia coli and Klebsiella. ID consult appreciated, continue with IV Unasyn , CT abdomen and pelvis with no evidence of intra-abdominal abscess, only significant for moderate amount of ascites, abdominal exam is benign - Continue with IV Unasyn during hospital stay, will change to Augmentin on discharge for total of 2 weeks per ID recommendation  Liver cirrhosis - Secondary to alcohol abuse, negative hepatitis panel, with evidence of portal hypertension and esophageal varices, will start on Nadolol, will add Aldactone today, and if stable my may add low-dose Lasix.   Alcoholism (HCC) - Has underlying portal hypertension with nonbleeding esophageal varices. Has shock liver secondary to primary insult. Chronic  thrombocytopenia. - LFTs slowly trending down. No signs of acute alcohol withdrawal at this time (although patient does get anxious).  - Liver ultrasound significant for cirrhotic liver and cholelithiasis. Hepatitis panel negative. - Continue thiamine, folate and multivitamin. - Counseled on alcohol cessation.  Thrombocytopenia/pancytopenia - Secondary to underlying cirrhosis and shock. Monitor. - Pancytopenia most likely related to alcohol toxicity  Elevated LFTs - In the setting of liver cirrhosis and shock liver, trending down  Hyporkalemia - Resolved  Diabetes mellitus type 2 - Monitor on sliding scale coverage. On NovoLog at home.  Depression - Continue risperidone and Celexa.   Code Status : Full code  Family Communication  : None at bedside  Disposition Plan  : Transfer to medical floor, hopefully can be discharged home in next 24 hour if hemoglobin remained stable on soft diet  Consults  :   [facility] ID    DVT Prophylaxis  : SCDs  Lab Results  Component Value Date   PLT 54 (L) 08/28/2015    Antibiotics  :    Anti-infectives    Start     Dose/Rate Route Frequency Ordered Stop   08/26/15 0030  Ampicillin-Sulbactam (UNASYN) 3 g in sodium chloride 0.9 % 100 mL IVPB     3 g 100 mL/hr over 60 Minutes Intravenous Every 6 hours 08/26/15 0016     08/25/15 1000  cefTRIAXone (ROCEPHIN) 2 g in dextrose 5 % 50 mL IVPB  Status:  Discontinued     2 g 100 mL/hr over 30 Minutes Intravenous Every 24 hours 08/25/15 0914 08/26/15 0008   08/23/15 2200  cefTRIAXone (ROCEPHIN) 2 g in dextrose 5 % 50 mL IVPB  Status:  Discontinued     2 g 100 mL/hr over 30 Minutes Intravenous Every 24 hours 08/22/15 0658 08/23/15 1407   08/23/15 2130  piperacillin-tazobactam (ZOSYN) IVPB 3.375 g  Status:  Discontinued     3.375 g 12.5 mL/hr over 240 Minutes Intravenous Every 8 hours 08/23/15 1448 08/25/15 0914   08/23/15 1530  piperacillin-tazobactam (ZOSYN) IVPB 3.375 g       3.375 g 100 mL/hr over 30 Minutes Intravenous  Once 08/23/15 1448 08/23/15 1638   08/23/15 0800  vancomycin (  VANCOCIN) IVPB 1000 mg/200 mL premix  Status:  Discontinued     1,000 mg 200 mL/hr over 60 Minutes Intravenous Every 8 hours 08/22/15 2317 08/23/15 1407   08/22/15 2330  cefTRIAXone (ROCEPHIN) 2 g in dextrose 5 % 50 mL IVPB     2 g 100 mL/hr over 30 Minutes Intravenous STAT 08/22/15 2317 08/23/15 0040   08/22/15 2330  vancomycin (VANCOCIN) 2,000 mg in sodium chloride 0.9 % 500 mL IVPB     2,000 mg 250 mL/hr over 120 Minutes Intravenous  Once 08/22/15 2317 08/23/15 0230   08/22/15 0700  cefTRIAXone (ROCEPHIN) 2 g in dextrose 5 % 50 mL IVPB  Status:  Discontinued     2 g 100 mL/hr over 30 Minutes Intravenous NOW 08/22/15 0658 08/22/15 1841        Objective:   Vitals:   08/27/15 2328 08/28/15 0400 08/28/15 0500 08/28/15 0800  BP: 115/70 114/72  119/79  Pulse:  77  76  Resp: Temp: 98.1 F (36.7 C) 98 F (36.7 C)  98.4 F (36.9 C)  TempSrc: Oral Oral  Oral  SpO2:  97%  100%  Weight:   113 kg (249 lb 1.9 oz)   Height:        Wt Readings from Last 3 Encounters:  08/28/15 113 kg (249 lb 1.9 oz)     Intake/Output Summary (Last 24 hours) at 08/28/15 1153 Last data filed at 08/28/15 1019  Gross per 24 hour  Intake             1490 ml  Output              700 ml  Net              790 ml     Physical Exam  Gen: not in distress HEENT: Pallor present, moist mucosa, supple neck Chest: clear b/l, no added sounds CVS: N S1&S2, no murmurs, rubs or gallop GI: soft, NT, ND, BS+ Musculoskeletal: warm, no edema CNS: AAOX3, non focal    Data Review:    CBC  Recent Labs Lab 08/22/15 0147  08/22/15 0742  08/25/15 1843 08/26/15 0624 08/26/15 1419 08/26/15 1637 08/27/15 0516 08/28/15 0635  WBC 4.3  < > 4.5  < > 3.7* 2.2* 1.0*  --  1.7* 1.2*  HGB 8.1*  < > 5.8*  < > 8.3* 8.3* 8.0*  --  8.6* 8.5*  HCT 25.7*  < > 19.3*  < > 25.0* 26.1* 24.4*  --   26.4* 27.2*  PLT 73*  < > 61*  < > PLATELET CLUMPS NOTED ON SMEAR, UNABLE TO ESTIMATE 34* 39*  --  44* 54*  MCV 86.5  < > 88.9  < > 85.3 87.3 87.1  --  86.8 88.6  MCH 27.3  < > 26.7  < > 28.3 27.8 28.6  --  28.3 27.7  MCHC 31.5  < > 30.1  < > 33.2 31.8 32.8  --  32.6 31.3  RDW 17.5*  < > 17.7*  < > 16.5* 16.3* 16.9*  --  16.5* 17.7*  LYMPHSABS 1.2  --  0.5*  --   --   --   --  0.6*  --   --   MONOABS 0.3  --  0.5  --   --   --   --  0.5  --   --   EOSABS 0.0  --  0.0  --   --   --   --  0.0  --   --   BASOSABS 0.0  --  0.0  --   --   --   --  0.0  --   --   < > = values in this interval not displayed.  Chemistries   Recent Labs Lab 08/22/15 0742  08/23/15 0950 08/24/15 0350 08/24/15 1746 08/25/15 0440 08/26/15 0624 08/27/15 0516 08/28/15 0635  NA 144  < >  --  145  --  144 139 139 140  K 5.6*  < >  --  3.7  --  3.3* 3.6 3.5 3.2*  CL 110  < >  --  110  --  108 103 104 105  CO2 12*  < >  --  29  --  28 31 29 29   GLUCOSE 163*  < >  --  197*  --  163* 224* 195* 174*  BUN 17  < >  --  29*  --  20 8 5* <5*  CREATININE 1.02  < >  --  0.98  --  0.83 0.72 0.70 0.64  CALCIUM 7.2*  < >  --  6.7*  --  6.8* 7.6* 7.9* 7.9*  MG 1.5*  --  1.0*  --  1.3* 1.7  --   --   --   AST 763*  < >  --  594*  --  372* 203* 128* 114*  ALT 282*  < >  --  352*  --  314* 256* 189* 148*  ALKPHOS 84  < >  --  47  --  48 52 67 79  BILITOT 2.6*  < >  --  5.0*  --  5.1* 5.0* 5.9* 7.0*  < > = values in this interval not displayed. ------------------------------------------------------------------------------------------------------------------ No results for input(s): CHOL, HDL, LDLCALC, TRIG, CHOLHDL, LDLDIRECT in the last 72 hours.  Lab Results  Component Value Date   HGBA1C 6.3 (H) 08/26/2015   ------------------------------------------------------------------------------------------------------------------ No results for input(s): TSH, T4TOTAL, T3FREE, THYROIDAB in the last 72 hours.  Invalid input(s):  FREET3 ------------------------------------------------------------------------------------------------------------------ No results for input(s): VITAMINB12, FOLATE, FERRITIN, TIBC, IRON, RETICCTPCT in the last 72 hours.  Coagulation profile  Recent Labs Lab 08/24/15 1746 08/25/15 0440 08/26/15 0624 08/27/15 0516 08/28/15 0635  INR 1.87 1.77 1.64 1.48 1.50    No results for input(s): DDIMER in the last 72 hours.  Cardiac Enzymes  Recent Labs Lab 08/22/15 0742  TROPONINI <0.03   ------------------------------------------------------------------------------------------------------------------    Component Value Date/Time   BNP 14.7 08/22/2015 0742    Inpatient Medications  Scheduled Meds: . ampicillin-sulbactam (UNASYN) IV  3 g Intravenous Q6H  . busPIRone  5 mg Oral BID  . citalopram  20 mg Oral Daily  . folic acid  1 mg Oral Daily  . insulin aspart  0-5 Units Subcutaneous QHS  . insulin aspart  0-9 Units Subcutaneous TID WC  . nadolol  20 mg Oral BID  . pantoprazole  40 mg Intravenous Q12H  . potassium chloride  40 mEq Oral BID  . sodium chloride flush  3 mL Intravenous Q12H  . thiamine  100 mg Oral Daily   Continuous Infusions:  PRN Meds:.sodium chloride, ALPRAZolam, [DISCONTINUED] ondansetron **OR** ondansetron (ZOFRAN) IV  Micro Results Recent Results (from the past 240 hour(s))  Culture, blood (routine x 2)     Status: Abnormal   Collection Time: 08/22/15  7:45 AM  Result Value Ref Range Status   Specimen Description BLOOD LEFT ANTECUBITAL  Final   Special Requests  BOTTLES DRAWN AEROBIC AND ANAEROBIC 5CC  Final   Culture  Setup Time   Final    GRAM NEGATIVE RODS GRAM POSITIVE COCCI IN PAIRS AND CHAINS IN BOTH AEROBIC AND ANAEROBIC BOTTLES CRITICAL RESULT CALLED TO, READ BACK BY AND VERIFIED WITH: Merlene Morse PHARMD 2254 08/22/15 A BROWNING    Culture (A)  Final    ESCHERICHIA COLI KLEBSIELLA PNEUMONIAE ENTEROCOCCUS SPECIES    Report Status 08/25/2015  FINAL  Final   Organism ID, Bacteria ESCHERICHIA COLI  Final   Organism ID, Bacteria KLEBSIELLA PNEUMONIAE  Final   Organism ID, Bacteria ENTEROCOCCUS SPECIES  Final      Susceptibility   Escherichia coli - MIC*    AMPICILLIN <=2 SENSITIVE Sensitive     CEFAZOLIN <=4 SENSITIVE Sensitive     CEFEPIME <=1 SENSITIVE Sensitive     CEFTAZIDIME <=1 SENSITIVE Sensitive     CEFTRIAXONE <=1 SENSITIVE Sensitive     CIPROFLOXACIN <=0.25 SENSITIVE Sensitive     GENTAMICIN <=1 SENSITIVE Sensitive     IMIPENEM <=0.25 SENSITIVE Sensitive     TRIMETH/SULFA <=20 SENSITIVE Sensitive     AMPICILLIN/SULBACTAM <=2 SENSITIVE Sensitive     PIP/TAZO <=4 SENSITIVE Sensitive     * ESCHERICHIA COLI   Enterococcus species - MIC*    AMPICILLIN <=2 SENSITIVE Sensitive     VANCOMYCIN 2 SENSITIVE Sensitive     GENTAMICIN SYNERGY SENSITIVE Sensitive     LINEZOLID 2 SENSITIVE Sensitive     * ENTEROCOCCUS SPECIES   Klebsiella pneumoniae - MIC*    AMPICILLIN >=32 RESISTANT Resistant     CEFAZOLIN <=4 SENSITIVE Sensitive     CEFEPIME <=1 SENSITIVE Sensitive     CEFTAZIDIME <=1 SENSITIVE Sensitive     CEFTRIAXONE <=1 SENSITIVE Sensitive     CIPROFLOXACIN <=0.25 SENSITIVE Sensitive     GENTAMICIN <=1 SENSITIVE Sensitive     IMIPENEM <=0.25 SENSITIVE Sensitive     TRIMETH/SULFA <=20 SENSITIVE Sensitive     AMPICILLIN/SULBACTAM 4 SENSITIVE Sensitive     PIP/TAZO <=4 SENSITIVE Sensitive     * KLEBSIELLA PNEUMONIAE  Blood Culture ID Panel (Reflexed)     Status: Abnormal   Collection Time: 08/22/15  7:45 AM  Result Value Ref Range Status   Enterococcus species DETECTED (A) NOT DETECTED Final    Comment: CRITICAL RESULT CALLED TO, READ BACK BY AND VERIFIED WITH: V BRYK PHARMD 2254 08/22/15 A BROWNING    Vancomycin resistance NOT DETECTED NOT DETECTED Final   Listeria monocytogenes NOT DETECTED NOT DETECTED Final   Staphylococcus species NOT DETECTED NOT DETECTED Final   Staphylococcus aureus NOT DETECTED NOT  DETECTED Final   Methicillin resistance NOT DETECTED NOT DETECTED Final   Streptococcus species NOT DETECTED NOT DETECTED Final   Streptococcus agalactiae NOT DETECTED NOT DETECTED Final   Streptococcus pneumoniae NOT DETECTED NOT DETECTED Final   Streptococcus pyogenes NOT DETECTED NOT DETECTED Final   Acinetobacter baumannii NOT DETECTED NOT DETECTED Final   Enterobacteriaceae species DETECTED (A) NOT DETECTED Final    Comment: CRITICAL RESULT CALLED TO, READ BACK BY AND VERIFIED WITH: V BRYK PHARMD 2254 08/22/15 A BROWNING    Enterobacter cloacae complex NOT DETECTED NOT DETECTED Final   Escherichia coli DETECTED (A) NOT DETECTED Final    Comment: CRITICAL RESULT CALLED TO, READ BACK BY AND VERIFIED WITH: V BRYK PHARMD 2254 08/22/15 A BROWNING    Klebsiella oxytoca NOT DETECTED NOT DETECTED Final   Klebsiella pneumoniae DETECTED (A) NOT DETECTED Final    Comment: CRITICAL RESULT  CALLED TO, READ BACK BY AND VERIFIED WITH: Merlene Morse PHARMD 2254 08/22/15 A BROWNING    Proteus species NOT DETECTED NOT DETECTED Final   Serratia marcescens NOT DETECTED NOT DETECTED Final   Carbapenem resistance NOT DETECTED NOT DETECTED Final   Haemophilus influenzae NOT DETECTED NOT DETECTED Final   Neisseria meningitidis NOT DETECTED NOT DETECTED Final   Pseudomonas aeruginosa NOT DETECTED NOT DETECTED Final   Candida albicans NOT DETECTED NOT DETECTED Final   Candida glabrata NOT DETECTED NOT DETECTED Final   Candida krusei NOT DETECTED NOT DETECTED Final   Candida parapsilosis NOT DETECTED NOT DETECTED Final   Candida tropicalis NOT DETECTED NOT DETECTED Final  Culture, blood (routine x 2)     Status: None   Collection Time: 08/22/15  7:53 AM  Result Value Ref Range Status   Specimen Description BLOOD LEFT HAND  Final   Special Requests BOTTLES DRAWN AEROBIC ONLY 8CC  Final   Culture NO GROWTH 5 DAYS  Final   Report Status 08/27/2015 FINAL  Final  MRSA PCR Screening     Status: None   Collection  Time: 08/22/15  1:20 PM  Result Value Ref Range Status   MRSA by PCR NEGATIVE NEGATIVE Final    Comment:        The GeneXpert MRSA Assay (FDA approved for NASAL specimens only), is one component of a comprehensive MRSA colonization surveillance program. It is not intended to diagnose MRSA infection nor to guide or monitor treatment for MRSA infections.   Culture, Urine     Status: None   Collection Time: 08/25/15 10:31 AM  Result Value Ref Range Status   Specimen Description URINE, CATHETERIZED  Final   Special Requests NONE  Final   Culture NO GROWTH  Final   Report Status 08/26/2015 FINAL  Final    Radiology Reports US Abdomen Complete  Result Date: 08/26/2015 CLINICAL DATA:  60 year old male with abnormal LFTs. Initial encounter. EXAM: ABDOMEN ULTRASOUND COMPLETE COMPARISON:  CT Abdomen and Pelvis 2150 hours today, reported separately. FINDINGS: Gallbladder: Small shadowing stones in the neck of the gallbladder (image 4). Estimated individual calculus size 7 mm. Gallbladder wall thickness remains normal. No sonographic Murphy sign elicited. Common bile duct: Diameter: 5 mm, normal Liver: Echogenic. Nodular contour (image 22). No discrete liver lesion. No intrahepatic biliary ductal dilatation. IVC: No abnormality visualized. Pancreas: Could not be visualized due to overlying bowel gas. Spleen: Splenomegaly. Estimated splenic volume 1096 mL (normal splenic volume range 83 - 412 mL). No discrete splenic lesion. Right Kidney: Length: 12.9 cm. Echogenicity within normal limits. No mass or hydronephrosis visualized. Left Kidney: Length: 13 cm. Echogenicity within normal limits. No mass or hydronephrosis visualized. Abdominal aorta: No aneurysm visualized. Other findings: Small volume ascites in the upper abdomen. IMPRESSION: 1. Hepatic steatosis with nodular liver contour suggesting Cirrhosis. 2. Splenomegaly and ascites suggesting portal hypertension. 3. Cholelithiasis without strong evidence  of acute cholecystitis. No evidence of biliary obstruction. 4. See also CT Abdomen and Pelvis from today reported separately. Electronically Signed   By: Odessa Fleming M.D.   On: 08/26/2015 23:33   Ct Abdomen Pelvis W Contrast  Result Date: 08/26/2015 CLINICAL DATA:  Sepsis. GI bleed, Mallory-Weiss tear with cautery 4 days prior. EXAM: CT ABDOMEN AND PELVIS WITH CONTRAST TECHNIQUE: Multidetector CT imaging of the abdomen and pelvis was performed using the standard protocol following bolus administration of intravenous contrast. CONTRAST:  ISOVUE-300 IOPAMIDOL (ISOVUE-300) INJECTION 61% COMPARISON:  No prior abdominal imaging. FINDINGS: Lower chest:  Small bilateral pleural effusions with adjacent atelectasis, right greater than left. Coronary artery calcifications are seen. Liver: Enlarged measuring 23 cm cranial caudal. Diffusely decreased density. No discrete focal hepatic lesion. There are nodular hepatic contours. Hepatobiliary: Calcified gallstones in the dependent gallbladder. No abnormal gallbladder distention. No biliary dilatation. Pancreas: No ductal dilatation or inflammation. Spleen: Enlarged measuring 8.6 x 14.4 x 19.7 cm (volume = 1268.6 cc). Small splenic cleft. Adrenal glands: No nodule. Kidneys: Symmetric renal enhancement and excretion. No hydronephrosis. No perinephric edema. Stomach/Bowel: Post gastric bypass. The gastric pouch appears small with enteric chain sutures located above the diaphragm. There is thickening of the distal esophagus. Esophageal varices are noted. Excluded gastric remnant is decompressed. The jejunal-jejunal anastomosis is unremarkable. No obstruction with enteric contrast reaching the colon. Mild colonic wall thickening involving the cecum and ascending colon may be portal enteropathy. Small to moderate stool throughout the remainder the colon. The appendix is normal. Vascular/Lymphatic: No retroperitoneal adenopathy. Abdominal aorta is normal in caliber. No evidence of  portal venous thrombus. Splenic vein is patent. Reproductive: Prostate gland is normal in size, mildly heterogeneous. Bladder: Distended. Small focus of air may be related to instrumentation. Other: Moderate volume intra-abdominal ascites. No free air. No loculated abscess. There is mesenteric edema. Whole body wall edema is most prominent in the flanks. Small umbilical hernia contains fat and ascites. Musculoskeletal: There are no acute or suspicious osseous abnormalities. Chronic change about both hips. Degenerative change in the spine. IMPRESSION: 1. Hepatomegaly with cirrhotic hepatic morphology and decreased density and, steatosis versus chronic liver disease. Portal hypertension with splenomegaly and esophageal varices. Wall thickening of the cecum and ascending colon may be portal enteropathy. Moderate volume of intra-abdominal ascites. 2. Cholelithiasis. 3. Post gastric bypass. Small gastric pouch with enteric sutures located above the diaphragm. Wall thickening of the distal esophagus. Electronically Signed   By: Rubye Oaks M.D.   On: 08/26/2015 23:38   Dg Chest Port 1 View  Result Date: 08/24/2015 CLINICAL DATA:  Fluid overload EXAM: PORTABLE CHEST 1 VIEW COMPARISON:  08/23/2015 FINDINGS: Endotracheal tube and right dialysis catheter are unchanged. Improving aeration. No confluent airspace opacities, effusions or edema. Heart is normal size. No acute bony abnormality. IMPRESSION: Stable support devices. No acute findings. Electronically Signed   By: Charlett Nose M.D.   On: 08/24/2015 08:15  Dg Chest Port 1 View  Result Date: 08/23/2015 CLINICAL DATA:  Fluid overload. EXAM: PORTABLE CHEST 1 VIEW COMPARISON:  One-view chest 08/22/2015 FINDINGS: The heart size is exaggerated by low lung volumes. Endotracheal tube and right IJ line are stable. Aeration is slightly improved. IMPRESSION: Improving aeration with persistent low lung volumes. Electronically Signed   By: Marin Roberts M.D.   On:  08/23/2015 12:28  Dg Chest Port 1 View  Result Date: 08/22/2015 CLINICAL DATA:  Status post intubation and central line placement today. EXAM: PORTABLE CHEST 1 VIEW COMPARISON:  Single-view of the chest earlier today. FINDINGS: Endotracheal tube is in place with the tip in good position 4 cm above the carina. Right IJ catheter tip projects in the lower superior vena cava. Negative for pneumothorax. Lungs are clear. Heart size is normal. No pleural effusion. IMPRESSION: ETT and right IJ catheter projecting good position. Negative for pneumothorax or acute disease. Electronically Signed   By: Drusilla Kanner M.D.   On: 08/22/2015 11:31  Dg Chest Port 1 View  Result Date: 08/22/2015 CLINICAL DATA:  Acute respiratory failure. EXAM: PORTABLE CHEST 1 VIEW COMPARISON:  None. FINDINGS: The heart  size and mediastinal contours are within normal limits. Both lungs are clear. The visualized skeletal structures are unremarkable. IMPRESSION: No active disease. Electronically Signed   By: Ted Mcalpine M.D.   On: 08/22/2015 07:38    Daijah Scrivens M.D on 08/28/2015 at 11:53 AM  Between 7am to 7pm - Pager - [phone]  After 7pm go to [email or web address] - password [facility]  [facility] -  Office  [phone]

## 2015-08-28 NOTE — Progress Notes (Signed)
Occupational Therapy Treatment Patient Details Name: Bradley Harris MRN: [id number] DOB: Sep 03, 1955 Today's Date: 08/28/2015    History of present illness 60 y.o. male with  Etohism, hx of gastric bypass, esophageal varices, admitted with GIB sp EGD with esophageal varices and MW tear found, also with polymicrobial bacteremia    OT comments  Pt demonstrates improving independence with ADLs and increasing activity tolerance, but still fatigues quickly.  He requires min guard - min A for ADLs   Follow Up Recommendations  No OT follow up;Supervision - Intermittent    Equipment Recommendations  Tub/shower seat    Recommendations for Other Services      Precautions / Restrictions Precautions Precautions: Fall       Mobility Bed Mobility Overal bed mobility: Modified Independent                Transfers Overall transfer level: Needs assistance Equipment used: 1 person hand held assist Transfers: Sit to/from Stand;Stand Pivot Transfers Sit to Stand: Min guard Stand pivot transfers: Min guard       General transfer comment: min guard for safety     Balance Overall balance assessment: Needs assistance   Sitting balance-Leahy Scale: Good     Standing balance support: No upper extremity supported;During functional activity Standing balance-Leahy Scale: Fair                     ADL Overall ADL's : Needs assistance/impaired     Grooming: Min guard;Standing;Oral care;Wash/dry face;Wash/dry hands               Lower Body Dressing: Min guard;Sit to/from stand   Toilet Transfer: Min guard;Ambulation;Comfort height toilet;RW   Toileting- Clothing Manipulation and Hygiene: Minimal assistance;Sit to/from stand       Functional mobility during ADLs: Min guard;Rolling walker General ADL Comments: Pt fearful of incontinence and self limits activity       Vision                     Perception     Praxis      Cognition   Behavior During  Therapy: WFL for tasks assessed/performed Overall Cognitive Status: Within Functional Limits for tasks assessed                       Extremity/Trunk Assessment               Exercises     Shoulder Instructions       General Comments      Pertinent Vitals/ Pain       Pain Assessment: No/denies pain  Home Living                                          Prior Functioning/Environment              Frequency Min 2X/week     Progress Toward Goals  OT Goals(current goals can now be found in the care plan section)  Progress towards OT goals: Progressing toward goals  ADL Goals Pt Will Perform Grooming: with modified independence;standing Pt Will Perform Upper Body Bathing: with modified independence;sitting Pt Will Perform Lower Body Bathing: with modified independence;sit to/from stand Pt Will Perform Upper Body Dressing: with modified independence;sitting Pt Will Perform Lower Body Dressing: with modified independence;sit to/from stand Pt Will Transfer to Toilet: with modified independence;ambulating;regular height  toilet;grab bars Pt Will Perform Toileting - Clothing Manipulation and hygiene: with modified independence;sit to/from stand Pt Will Perform Tub/Shower Transfer: Tub transfer;with supervision;ambulating;shower seat  Plan Discharge plan remains appropriate    Co-evaluation                 End of Session Equipment Utilized During Treatment: Rolling walker   Activity Tolerance Patient tolerated treatment well   Patient Left in chair;with call bell/phone within reach   Nurse Communication Mobility status        Time: 1410-1440 OT Time Calculation (min): 30 min  Charges: OT General Charges $OT Visit: 1 Procedure OT Treatments $Self Care/Home Management : 8-22 mins $Therapeutic Activity: 8-22 mins  Bradley Harris M 08/28/2015, 5:38 PM

## 2015-08-28 NOTE — Progress Notes (Signed)
Pharmacy Antibiotic Note  Bradley Harris is a 60 y.o. male admitted on 7/28/2017now with bacteremia.  Pharmacy has been consulted for Unasyn dosing.  Currently on Unasyn for polymicrobial bacteremia, SBP (Day #6 of total abx). CT of abdomen shows cholelithiasis with possible acute cholecystitis. ID recommends continuing Unasyn while inpatient, then switch to Augmentin for remainder of 2 week total course. Afebrile, WBC low at 1.2.  Plan: Continue Unasyn 3g IV Q6 Monitor clinical picture, renal function F/U transition to Augmentin at discharge Stop date should be 8/11  Height: 6\' 4"  (193 cm) Weight: 249 lb 1.9 oz (113 kg) IBW/kg (Calculated) : 86.8  Temp (24hrs), Avg:98.1 F (36.7 C), Min:98 F (36.7 C), Max:98.4 F (36.9 C)   Recent Labs Lab 08/22/15 0742  08/22/15 1345  08/23/15 0259 08/23/15 0940  08/23/15 1145  08/24/15 0350  08/25/15 0440 08/25/15 1843 08/26/15 0624 08/26/15 1419 08/27/15 0516 08/28/15 0635  WBC 4.5  < > 9.3  < > 11.7*  --   < >  --   < >  --   < > 2.8* 3.7* 2.2* 1.0* 1.7* 1.2*  CREATININE 1.02  < >  --   --  1.60*  --   --   --   --  0.98  --  0.83  --  0.72  --  0.70 0.64  LATICACIDVEN 9.6*  --  9.8*  --  8.1* 6.1*  --  4.2*  --   --   --   --   --   --   --   --   --   < > = values in this interval not displayed.  Estimated Creatinine Clearance: 140.2 mL/min (by C-G formula based on SCr of 0.8 mg/dL).    No Known Allergies  Antimicrobials this admission: 7/29 ctx x1; 7/31 >> 8/1 7/29 vanc x1 7/29 Zosyn >> 7/31 8/1 Unasyn >>    Microbiology results: 7/28 Blood Cx: enterococcus, e.coli, klebisella  7/31 Urine >> ngF  Thank you for allowing pharmacy to be a part of this patient's care.  Enzo Bi, PharmD, BCPS Clinical Pharmacist Pager [phone] 08/28/2015 8:15 AM

## 2015-08-28 NOTE — Progress Notes (Signed)
Report given to patient's nurse on [phone]. Patient transferred via wheelchair with belongings ( including phone, Consulting civil engineer, clothing). CCMD notified as patient will no longer be on telemetry. Patient given insulin prior to transport. Service response made aware of transfer for food trays.  Noe Gens, RN

## 2015-08-29 DIAGNOSIS — E119 Type 2 diabetes mellitus without complications: Secondary | ICD-10-CM

## 2015-08-29 LAB — BASIC METABOLIC PANEL
Anion gap: 6 (ref 5–15)
BUN: 5 mg/dL — AB (ref 6–20)
CALCIUM: 7.9 mg/dL — AB (ref 8.9–10.3)
CO2: 26 mmol/L (ref 22–32)
CREATININE: 0.72 mg/dL (ref 0.61–1.24)
Chloride: 108 mmol/L (ref 101–111)
GFR calc non Af Amer: >60 mL/min (ref >60)
GLUCOSE: 191 mg/dL — AB (ref 65–99)
Potassium: 3.7 mmol/L (ref 3.5–5.1)
Sodium: 140 mmol/L (ref 135–145)

## 2015-08-29 LAB — PROTIME-INR
INR: 1.43
PROTHROMBIN TIME: 17.6 s — AB (ref 11.4–15.2)

## 2015-08-29 LAB — CBC
HCT: 26.3 % — ABNORMAL LOW (ref 39.0–52.0)
Hemoglobin: 8.2 g/dL — ABNORMAL LOW (ref 13.0–17.0)
MCH: 27.6 pg (ref 26.0–34.0)
MCHC: 31.2 g/dL (ref 30.0–36.0)
MCV: 88.6 fL (ref 78.0–100.0)
PLATELETS: 66 10*3/uL — AB (ref 150–400)
RBC: 2.97 MIL/uL — ABNORMAL LOW (ref 4.22–5.81)
RDW: 18.2 % — AB (ref 11.5–15.5)
WBC: 1.6 10*3/uL — ABNORMAL LOW (ref 4.0–10.5)

## 2015-08-29 LAB — GLUCOSE, CAPILLARY
GLUCOSE-CAPILLARY: 162 mg/dL — AB (ref 65–99)
Glucose-Capillary: 203 mg/dL — ABNORMAL HIGH (ref 65–99)
Glucose-Capillary: 195 mg/dL — ABNORMAL HIGH (ref 65–99)

## 2015-08-29 MED ORDER — FOLIC ACID 1 MG PO TABS: 1.0000 mg | ORAL_TABLET | Freq: Every day | ORAL | 0 refills | Status: AC

## 2015-08-29 MED ORDER — AMOXICILLIN-POT CLAVULANATE 875-125 MG PO TABS: 1.0000 | ORAL_TABLET | Freq: Two times a day (BID) | ORAL | 0 refills | Status: AC

## 2015-08-29 MED ORDER — PANTOPRAZOLE SODIUM 40 MG PO TBEC: 40.0000 mg | DELAYED_RELEASE_TABLET | Freq: Two times a day (BID) | ORAL | 0 refills | Status: AC

## 2015-08-29 MED ORDER — THIAMINE HCL 100 MG PO TABS: 100.0000 mg | ORAL_TABLET | Freq: Every day | ORAL | 0 refills | Status: AC

## 2015-08-29 MED ORDER — SACCHAROMYCES BOULARDII 250 MG PO CAPS: 250.0000 mg | ORAL_CAPSULE | Freq: Two times a day (BID) | ORAL | 0 refills | Status: AC

## 2015-08-29 NOTE — Discharge Summary (Signed)
Bradley Harris, is a 60 y.o. male  DOB 12/26/1955  MRN [id number].  Admission date:  08/22/2015  Admitting Physician  Chilton Greathouse, MD  Discharge Date:  08/29/2015   Primary MD  No PCP Per Patient  Recommendations for primary care physician for things to follow:  - Please check CBC, BMP in 1 week, patient reports is going back to [place] in couple days, she asked to follow with his PCP.  Admission Diagnosis  Acute respiratory failure (HCC) [J96.00] Upper gastrointestinal bleeding [K92.2] Diabetes mellitus without complication (HCC) [E11.9] Alcoholism (HCC) [F10.20] Hypercholesterolemia [E78.00] Neuropathy (HCC) [G62.9] Depression [F32.9] Thrombocytopenia (HCC) [D69.6] Elevated liver enzymes [R74.8] Elevated lactic acid level [E87.2] Alcohol intoxication, uncomplicated (HCC) [F10.120]   Discharge Diagnosis  Acute respiratory failure (HCC) [J96.00] Upper gastrointestinal bleeding [K92.2] Diabetes mellitus without complication (HCC) [E11.9] Alcoholism (HCC) [F10.20] Hypercholesterolemia [E78.00] Neuropathy (HCC) [G62.9] Depression [F32.9] Thrombocytopenia (HCC) [D69.6] Elevated liver enzymes [R74.8] Elevated lactic acid level [E87.2] Alcohol intoxication, uncomplicated (HCC) [F10.120]    Principal Problem:   Bleeding gastrointestinal Active Problems:   Alcoholism (HCC)   Depression   Diabetes mellitus without complication (HCC)   Hypercholesterolemia   Neuropathy (HCC)   Elevated lactic acid level   Thrombocytopenia (HCC)   Hemorrhagic shock   Acute respiratory failure (HCC)   Portal hypertension with esophageal varices (HCC)   Alcohol intoxication (HCC)   Elevated liver enzymes   Uncontrolled type 2 diabetes mellitus with ketoacidosis without coma, without long-term current use of insulin (HCC)   Polymicrobial bacterial infection   Bacteremia   Mallory-Weiss tear      Past  Medical History:  Diagnosis Date  . Alcoholism (HCC)   . Depression   . Diabetes mellitus without complication (HCC)   . Hypercholesterolemia   . Neuropathy [facility])     Past Surgical History:  Procedure Laterality Date  . ESOPHAGOGASTRODUODENOSCOPY Left 08/22/2015   Procedure: ESOPHAGOGASTRODUODENOSCOPY (EGD);  Surgeon: Charlott Rakes, MD;  Location: [facility] ENDOSCOPY;  Service: Endoscopy;  Laterality: Left;  . Gastric bypass surgery    . LUMBAR SPINE SURGERY    . SHOULDER SURGERY         History of present illness and  Hospital Course:     Kindly see H&P for history of present illness and admission details, please review complete Labs, Consult reports and Test reports for all details in brief  HPI  from the history and physical done on the day of admission 08/22/2015  HPI: Bradley Harris is a 60 y.o. male with medical history significant of alcohol abuse, diabetes mellitus, depression, hyperlipidemia, who presents with hematemesis and dark stool.  Patient reports that he had 12 times of hematemesis with small amount of bright red blood today. He also had 2 episode of dark stool with large amount of black clots. He has generalized weakness and lightheadedness. He denies chest pain, shortness of breath or palpitation. No abdominal pain. Patient denies symptoms of UTI or unilateral weakness.  ED Course: pt was found to have tachycardia and hypotension with blood pressure  83/51, which improved to 97/61 after 2 L of normal saline bolus. Hemoglobin 8.1 (no previous hemoglobin data available), lactate 8.0, WBC 4.3, platelet 73, electrolytes and renal function okay, alcohol level 316, INR 1.36. Abnormal liver function with total bilirubin 2.5, AST 240, ALT 93 and ALP 107. Pt is placed on SDU for obs.GI, Dr. Dulce Sellar was consulted by EDP.  Hospital Course   Brief Narrative  60 year old alcoholic male (drinks one bottle of bourbon daily), with cirrhosis (ascites and portal hypertension), history  of diabetes mellitus, hyperlipidemia and depression, gastric bypass surgery, admitted to the hospitalist service on 7/28 with several episodes of hematemesis and melena for 2 days. He is visiting his girlfriend from [place]. In the ED he was tachycardic and hypotensive which improved with 2 L normal saline bolus. His hemoglobin was 8.1 with lactate of 8 and platelets of 73. Alcohol level was 316, INR is 1.36 with transaminitis. Patient admitted to stepdown unit and [facility] consulted. Patient was ordered for PRBC transfusion but soon after he was started he got up and became tachycardic and hypotensive, possible large amount of dark stool. Patient transferred to ICU with hemorrhagic shock and placed on Levophed drip and intubated for airway protection. Patient had urgent EGD done showing bleeding Mallory-Weiss tear and hemostasis achieved with  Cautery. Also showed nonbleeding esophageal varices. Patient continued on octreotide and Protonix drip. Blood cultures on admission growing Escherichia coli, Klebsiella and enterococcus.  SIGNIFICANT EVENTS: 7/28 >>Admitted to SDU  For active upper GI bleed, hypotension-->> transferred to ICU for hemorrhagic shock 7/28 >> intubated to secure airway for EGD; EGD showing bleeding Mallory-Weiss tear and underwent cautery. Managed in the ICU on pressors. Polymicrobial bacteremia on blood cultures. 7/29 >> off pressors 7/30 >> Extubated 7/31>> transfer to stepdown on the hospitalist service.  LINES/TUBES: PIV 7/28  ETT 7/28 >7/30 CVC R IJ 7/28  Foley 7/28   Hemorrhagic shock secondary to bleeding Mallory-Weiss tear - Required ICU admission with pressors and intubation for airway protection. - EGD with cauterization of Mallory-Weiss tear. Off octreotide and PPI drip. Continue twice a day  Protonix on discharge. -  H&H stable after 6 unit PRBC. Seen by surgery and recommend angioembolization if he has persistent GI bleed. - Hemoglobin seems to be stable  over the last few days, no evidence of further bleed, tolerating soft diet over last 24 hours, multiple bowel movement, normal color, soft, no bleed .  Lactic acidosis - Secondary to shock and? Bacteremia. Now resolved.  Polymicrobial bacteremia - Blood culture 1/2 growing enterococcus, Escherichia coli and Klebsiella. ID consult appreciated, continue with IV Unasyn , CT abdomen and pelvis with no evidence of intra-abdominal abscess, only significant for moderate amount of ascites, abdominal exam is benign - Treated with IV Unasyn during hospital stay, changed to Augmentin on discharge for total of 2 weeks per ID recommendation (another 6 days of by mouth Augmentin as an outpatient)  Liver cirrhosis - Secondary to alcohol abuse, negative hepatitis panel, with evidence of portal hypertension and esophageal varices, and tried on low-dose nadolol and Aldactone during hospital stay, but was unable to tolerate secondary to soft blood pressure , sono new medication added on discharge .   Alcoholism (HCC) - Has underlying portal hypertension with nonbleeding esophageal varices. Has shock liver secondary to primary insult. Chronic thrombocytopenia. - LFTs slowly trending down. No signs of acute alcohol withdrawal at this time (although patient does get anxious).  - Liver ultrasound significant for cirrhotic liver and cholelithiasis. Hepatitis panel  negative. - Continue thiamine, folate and multivitamin. - Counseled on alcohol cessation.  Thrombocytopenia/pancytopenia - Secondary to underlying cirrhosis and shock. Monitor. - Pancytopenia most likely related to alcohol toxicity  Elevated LFTs - In the setting of liver cirrhosis and shock liver, trending down  Hyporkalemia - Resolved  Diabetes mellitus type 2 - Continue home medication on discharge  Depression - Continue risperidone and Celexa.   Discharge Condition:  Stable   Follow UP   with PCP   Discharge Instructions   and  Discharge Medications     Discharge Instructions    Discharge instructions    Complete by:  As directed   Follow with Primary MD  in 7 days   Get CBC, CMP,  checked  by Primary MD next visit.    Activity: As tolerated with Full fall precautions use walker/cane & assistance as needed   Disposition Home    Diet: Low-salt, carbohydrate modified, with feeding assistance and aspiration precautions.  For Heart failure patients - Check your Weight same time everyday, if you gain over 2 pounds, or you develop in leg swelling, experience more shortness of breath or chest pain, call your Primary MD immediately. Follow Cardiac Low Salt Diet and 1.5 lit/day fluid restriction.   On your next visit with your primary care physician please Get Medicines reviewed and adjusted.   Please request your Prim.MD to go over all Hospital Tests and Procedure/Radiological results at the follow up, please get all Hospital records sent to your Prim MD by signing hospital release before you go home.   If you experience worsening of your admission symptoms, develop shortness of breath, life threatening emergency, suicidal or homicidal thoughts you must seek medical attention immediately by calling 911 or calling your MD immediately  if symptoms less severe.  You Must read complete instructions/literature along with all the possible adverse reactions/side effects for all the Medicines you take and that have been prescribed to you. Take any new Medicines after you have completely understood and accpet all the possible adverse reactions/side effects.   Do not drive, operating heavy machinery, perform activities at heights, swimming or participation in water activities or provide baby sitting services if your were admitted for syncope or siezures until you have seen by Primary MD or a Neurologist and advised to do so again.  Do not drive when taking Pain medications.    Do not take more than prescribed Pain,  Sleep and Anxiety Medications  Special Instructions: If you have smoked or chewed Tobacco  in the last 2 yrs please stop smoking, stop any regular Alcohol  and or any Recreational drug use.  Wear Seat belts while driving.   Please note  You were cared for by a hospitalist during your hospital stay. If you have any questions about your discharge medications or the care you received while you were in the hospital after you are discharged, you can call the unit and asked to speak with the hospitalist on call if the hospitalist that took care of you is not available. Once you are discharged, your primary care physician will handle any further medical issues. Please note that NO REFILLS for any discharge medications will be authorized once you are discharged, as it is imperative that you return to your primary care physician (or establish a relationship with a primary care physician if you do not have one) for your aftercare needs so that they can reassess your need for medications and monitor your lab values.   Increase  activity slowly    Complete by:  As directed       Medication List    STOP taking these medications   omeprazole 20 MG capsule Commonly known as:  PRILOSEC     TAKE these medications   amoxicillin-clavulanate 875-125 MG tablet Commonly known as:  AUGMENTIN Take 1 tablet by mouth 2 (two) times daily.   busPIRone 5 MG tablet Commonly known as:  BUSPAR Take 5 mg by mouth 2 (two) times daily.   citalopram 20 MG tablet Commonly known as:  CELEXA Take 20 mg by mouth daily.   folic acid 1 MG tablet Commonly known as:  FOLVITE Take 1 tablet (1 mg total) by mouth daily.   gabapentin 300 MG capsule Commonly known as:  NEURONTIN Take 300 mg by mouth 3 (three) times daily.   insulin aspart 100 UNIT/ML injection Commonly known as:  novoLOG Inject 1-10 Units into the skin daily as needed for high blood sugar.   insulin aspart protamine- aspart (70-30) 100 UNIT/ML  injection Commonly known as:  NOVOLOG MIX 70/30 Inject 10-16 Units into the skin 2 (two) times daily with a meal. Use 16 units every morning and use 10 units every night   pantoprazole 40 MG tablet Commonly known as:  PROTONIX Take 1 tablet (40 mg total) by mouth 2 (two) times daily.   saccharomyces boulardii 250 MG capsule Commonly known as:  FLORASTOR Take 1 capsule (250 mg total) by mouth 2 (two) times daily.   simvastatin 20 MG tablet Commonly known as:  ZOCOR Take 20 mg by mouth daily.   thiamine 100 MG tablet Take 1 tablet (100 mg total) by mouth daily.         Diet and Activity recommendation: See Discharge Instructions above   Consults obtained -  P[facility] ID   Major procedures and Radiology Reports - PLEASE review detailed and final reports for all details, in brief -      US Abdomen Complete  Result Date: 08/26/2015 CLINICAL DATA:  60 year old male with abnormal LFTs. Initial encounter. EXAM: ABDOMEN ULTRASOUND COMPLETE COMPARISON:  CT Abdomen and Pelvis 2150 hours today, reported separately. FINDINGS: Gallbladder: Small shadowing stones in the neck of the gallbladder (image 4). Estimated individual calculus size 7 mm. Gallbladder wall thickness remains normal. No sonographic Murphy sign elicited. Common bile duct: Diameter: 5 mm, normal Liver: Echogenic. Nodular contour (image 22). No discrete liver lesion. No intrahepatic biliary ductal dilatation. IVC: No abnormality visualized. Pancreas: Could not be visualized due to overlying bowel gas. Spleen: Splenomegaly. Estimated splenic volume 1096 mL (normal splenic volume range 83 - 412 mL). No discrete splenic lesion. Right Kidney: Length: 12.9 cm. Echogenicity within normal limits. No mass or hydronephrosis visualized. Left Kidney: Length: 13 cm. Echogenicity within normal limits. No mass or hydronephrosis visualized. Abdominal aorta: No aneurysm visualized. Other findings: Small volume ascites in the  upper abdomen. IMPRESSION: 1. Hepatic steatosis with nodular liver contour suggesting Cirrhosis. 2. Splenomegaly and ascites suggesting portal hypertension. 3. Cholelithiasis without strong evidence of acute cholecystitis. No evidence of biliary obstruction. 4. See also CT Abdomen and Pelvis from today reported separately. Electronically Signed   By: Odessa Fleming M.D.   On: 08/26/2015 23:33   Ct Abdomen Pelvis W Contrast  Result Date: 08/26/2015 CLINICAL DATA:  Sepsis. GI bleed, Mallory-Weiss tear with cautery 4 days prior. EXAM: CT ABDOMEN AND PELVIS WITH CONTRAST TECHNIQUE: Multidetector CT imaging of the abdomen and pelvis was performed using the standard protocol following  bolus administration of intravenous contrast. CONTRAST:  ISOVUE-300 IOPAMIDOL (ISOVUE-300) INJECTION 61% COMPARISON:  No prior abdominal imaging. FINDINGS: Lower chest: Small bilateral pleural effusions with adjacent atelectasis, right greater than left. Coronary artery calcifications are seen. Liver: Enlarged measuring 23 cm cranial caudal. Diffusely decreased density. No discrete focal hepatic lesion. There are nodular hepatic contours. Hepatobiliary: Calcified gallstones in the dependent gallbladder. No abnormal gallbladder distention. No biliary dilatation. Pancreas: No ductal dilatation or inflammation. Spleen: Enlarged measuring 8.6 x 14.4 x 19.7 cm (volume = 1268.6 cc). Small splenic cleft. Adrenal glands: No nodule. Kidneys: Symmetric renal enhancement and excretion. No hydronephrosis. No perinephric edema. Stomach/Bowel: Post gastric bypass. The gastric pouch appears small with enteric chain sutures located above the diaphragm. There is thickening of the distal esophagus. Esophageal varices are noted. Excluded gastric remnant is decompressed. The jejunal-jejunal anastomosis is unremarkable. No obstruction with enteric contrast reaching the colon. Mild colonic wall thickening involving the cecum and ascending colon may be portal  enteropathy. Small to moderate stool throughout the remainder the colon. The appendix is normal. Vascular/Lymphatic: No retroperitoneal adenopathy. Abdominal aorta is normal in caliber. No evidence of portal venous thrombus. Splenic vein is patent. Reproductive: Prostate gland is normal in size, mildly heterogeneous. Bladder: Distended. Small focus of air may be related to instrumentation. Other: Moderate volume intra-abdominal ascites. No free air. No loculated abscess. There is mesenteric edema. Whole body wall edema is most prominent in the flanks. Small umbilical hernia contains fat and ascites. Musculoskeletal: There are no acute or suspicious osseous abnormalities. Chronic change about both hips. Degenerative change in the spine. IMPRESSION: 1. Hepatomegaly with cirrhotic hepatic morphology and decreased density and, steatosis versus chronic liver disease. Portal hypertension with splenomegaly and esophageal varices. Wall thickening of the cecum and ascending colon may be portal enteropathy. Moderate volume of intra-abdominal ascites. 2. Cholelithiasis. 3. Post gastric bypass. Small gastric pouch with enteric sutures located above the diaphragm. Wall thickening of the distal esophagus. Electronically Signed   By: Rubye Oaks M.D.   On: 08/26/2015 23:38   Dg Chest Port 1 View  Result Date: 08/24/2015 CLINICAL DATA:  Fluid overload EXAM: PORTABLE CHEST 1 VIEW COMPARISON:  08/23/2015 FINDINGS: Endotracheal tube and right dialysis catheter are unchanged. Improving aeration. No confluent airspace opacities, effusions or edema. Heart is normal size. No acute bony abnormality. IMPRESSION: Stable support devices. No acute findings. Electronically Signed   By: Charlett Nose M.D.   On: 08/24/2015 08:15  Dg Chest Port 1 View  Result Date: 08/23/2015 CLINICAL DATA:  Fluid overload. EXAM: PORTABLE CHEST 1 VIEW COMPARISON:  One-view chest 08/22/2015 FINDINGS: The heart size is exaggerated by low lung volumes.  Endotracheal tube and right IJ line are stable. Aeration is slightly improved. IMPRESSION: Improving aeration with persistent low lung volumes. Electronically Signed   By: Marin Roberts M.D.   On: 08/23/2015 12:28  Dg Chest Port 1 View  Result Date: 08/22/2015 CLINICAL DATA:  Status post intubation and central line placement today. EXAM: PORTABLE CHEST 1 VIEW COMPARISON:  Single-view of the chest earlier today. FINDINGS: Endotracheal tube is in place with the tip in good position 4 cm above the carina. Right IJ catheter tip projects in the lower superior vena cava. Negative for pneumothorax. Lungs are clear. Heart size is normal. No pleural effusion. IMPRESSION: ETT and right IJ catheter projecting good position. Negative for pneumothorax or acute disease. Electronically Signed   By: Drusilla Kanner M.D.   On: 08/22/2015 11:31  Dg Chest Port 1  View  Result Date: 08/22/2015 CLINICAL DATA:  Acute respiratory failure. EXAM: PORTABLE CHEST 1 VIEW COMPARISON:  None. FINDINGS: The heart size and mediastinal contours are within normal limits. Both lungs are clear. The visualized skeletal structures are unremarkable. IMPRESSION: No active disease. Electronically Signed   By: Ted Mcalpine M.D.   On: 08/22/2015 07:38   Micro Results    Recent Results (from the past 240 hour(s))  Culture, blood (routine x 2)     Status: Abnormal   Collection Time: 08/22/15  7:45 AM  Result Value Ref Range Status   Specimen Description BLOOD LEFT ANTECUBITAL  Final   Special Requests BOTTLES DRAWN AEROBIC AND ANAEROBIC 5CC  Final   Culture  Setup Time   Final    GRAM NEGATIVE RODS GRAM POSITIVE COCCI IN PAIRS AND CHAINS IN BOTH AEROBIC AND ANAEROBIC BOTTLES CRITICAL RESULT CALLED TO, READ BACK BY AND VERIFIED WITHMerlene Morse PHARMD 2254 08/22/15 A BROWNING    Culture (A)  Final    ESCHERICHIA COLI KLEBSIELLA PNEUMONIAE ENTEROCOCCUS SPECIES    Report Status 08/25/2015 FINAL  Final   Organism ID,  Bacteria ESCHERICHIA COLI  Final   Organism ID, Bacteria KLEBSIELLA PNEUMONIAE  Final   Organism ID, Bacteria ENTEROCOCCUS SPECIES  Final      Susceptibility   Escherichia coli - MIC*    AMPICILLIN <=2 SENSITIVE Sensitive     CEFAZOLIN <=4 SENSITIVE Sensitive     CEFEPIME <=1 SENSITIVE Sensitive     CEFTAZIDIME <=1 SENSITIVE Sensitive     CEFTRIAXONE <=1 SENSITIVE Sensitive     CIPROFLOXACIN <=0.25 SENSITIVE Sensitive     GENTAMICIN <=1 SENSITIVE Sensitive     IMIPENEM <=0.25 SENSITIVE Sensitive     TRIMETH/SULFA <=20 SENSITIVE Sensitive     AMPICILLIN/SULBACTAM <=2 SENSITIVE Sensitive     PIP/TAZO <=4 SENSITIVE Sensitive     * ESCHERICHIA COLI   Enterococcus species - MIC*    AMPICILLIN <=2 SENSITIVE Sensitive     VANCOMYCIN 2 SENSITIVE Sensitive     GENTAMICIN SYNERGY SENSITIVE Sensitive     LINEZOLID 2 SENSITIVE Sensitive     * ENTEROCOCCUS SPECIES   Klebsiella pneumoniae - MIC*    AMPICILLIN >=32 RESISTANT Resistant     CEFAZOLIN <=4 SENSITIVE Sensitive     CEFEPIME <=1 SENSITIVE Sensitive     CEFTAZIDIME <=1 SENSITIVE Sensitive     CEFTRIAXONE <=1 SENSITIVE Sensitive     CIPROFLOXACIN <=0.25 SENSITIVE Sensitive     GENTAMICIN <=1 SENSITIVE Sensitive     IMIPENEM <=0.25 SENSITIVE Sensitive     TRIMETH/SULFA <=20 SENSITIVE Sensitive     AMPICILLIN/SULBACTAM 4 SENSITIVE Sensitive     PIP/TAZO <=4 SENSITIVE Sensitive     * KLEBSIELLA PNEUMONIAE  Blood Culture ID Panel (Reflexed)     Status: Abnormal   Collection Time: 08/22/15  7:45 AM  Result Value Ref Range Status   Enterococcus species DETECTED (A) NOT DETECTED Final    Comment: CRITICAL RESULT CALLED TO, READ BACK BY AND VERIFIED WITH: V BRYK PHARMD 2254 08/22/15 A BROWNING    Vancomycin resistance NOT DETECTED NOT DETECTED Final   Listeria monocytogenes NOT DETECTED NOT DETECTED Final   Staphylococcus species NOT DETECTED NOT DETECTED Final   Staphylococcus aureus NOT DETECTED NOT DETECTED Final   Methicillin  resistance NOT DETECTED NOT DETECTED Final   Streptococcus species NOT DETECTED NOT DETECTED Final   Streptococcus agalactiae NOT DETECTED NOT DETECTED Final   Streptococcus pneumoniae NOT DETECTED NOT DETECTED Final   Streptococcus  pyogenes NOT DETECTED NOT DETECTED Final   Acinetobacter baumannii NOT DETECTED NOT DETECTED Final   Enterobacteriaceae species DETECTED (A) NOT DETECTED Final    Comment: CRITICAL RESULT CALLED TO, READ BACK BY AND VERIFIED WITH: V BRYK PHARMD 2254 08/22/15 A BROWNING    Enterobacter cloacae complex NOT DETECTED NOT DETECTED Final   Escherichia coli DETECTED (A) NOT DETECTED Final    Comment: CRITICAL RESULT CALLED TO, READ BACK BY AND VERIFIED WITH: V BRYK PHARMD 2254 08/22/15 A BROWNING    Klebsiella oxytoca NOT DETECTED NOT DETECTED Final   Klebsiella pneumoniae DETECTED (A) NOT DETECTED Final    Comment: CRITICAL RESULT CALLED TO, READ BACK BY AND VERIFIED WITH: V BRYK PHARMD 2254 08/22/15 A BROWNING    Proteus species NOT DETECTED NOT DETECTED Final   Serratia marcescens NOT DETECTED NOT DETECTED Final   Carbapenem resistance NOT DETECTED NOT DETECTED Final   Haemophilus influenzae NOT DETECTED NOT DETECTED Final   Neisseria meningitidis NOT DETECTED NOT DETECTED Final   Pseudomonas aeruginosa NOT DETECTED NOT DETECTED Final   Candida albicans NOT DETECTED NOT DETECTED Final   Candida glabrata NOT DETECTED NOT DETECTED Final   Candida krusei NOT DETECTED NOT DETECTED Final   Candida parapsilosis NOT DETECTED NOT DETECTED Final   Candida tropicalis NOT DETECTED NOT DETECTED Final  Culture, blood (routine x 2)     Status: None   Collection Time: 08/22/15  7:53 AM  Result Value Ref Range Status   Specimen Description BLOOD LEFT HAND  Final   Special Requests BOTTLES DRAWN AEROBIC ONLY 8CC  Final   Culture NO GROWTH 5 DAYS  Final   Report Status 08/27/2015 FINAL  Final  MRSA PCR Screening     Status: None   Collection Time: 08/22/15  1:20 PM  Result  Value Ref Range Status   MRSA by PCR NEGATIVE NEGATIVE Final    Comment:        The GeneXpert MRSA Assay (FDA approved for NASAL specimens only), is one component of a comprehensive MRSA colonization surveillance program. It is not intended to diagnose MRSA infection nor to guide or monitor treatment for MRSA infections.   Culture, Urine     Status: None   Collection Time: 08/25/15 10:31 AM  Result Value Ref Range Status   Specimen Description URINE, CATHETERIZED  Final   Special Requests NONE  Final   Culture NO GROWTH  Final   Report Status 08/26/2015 FINAL  Final       Today   Subjective:   Len Childs today has no headache,no chest abdominal pain,no new weakness tingling or numbness, feels much better wants to go home today, Reports some diarrhea, non-watery, soft bowel movements.  Objective:   Blood pressure 111/64, pulse 75, temperature 98.5 F (36.9 C), temperature source Oral, resp. rate 16, height  (1.93 m), weight 113 kg (249 lb 1.9 oz), SpO2 100 %.   Intake/Output Summary (Last 24 hours) at 08/29/15 1232 Last data filed at 08/29/15 0620  Gross per 24 hour  Intake              980 ml  Output              700 ml  Net              280 ml    Exam Gen: not in distress HEENT: Pallor present, moist mucosa, supple neck Chest: clear b/l, no added sounds CVS: N S1&S2, no murmurs, rubs or gallop  GI: soft, NT, ND, BS+ Musculoskeletal: warm, no edema CNS: AAOX3, non focal  Data Review   CBC w Diff: Lab Results  Component Value Date   WBC 1.6 (L) 08/29/2015   HGB 8.2 (L) 08/29/2015   HCT 26.3 (L) 08/29/2015   PLT 66 (L) 08/29/2015   LYMPHOPCT 27 08/26/2015   MONOPCT 22 08/26/2015   EOSPCT 1 08/26/2015   BASOPCT 1 08/26/2015    CMP: Lab Results  Component Value Date   NA 140 08/29/2015   K 3.7 08/29/2015   CL 108 08/29/2015   CO2 26 08/29/2015   BUN 5 (L) 08/29/2015   CREATININE 0.72 08/29/2015   PROT 5.1 (L) 08/28/2015   ALBUMIN 2.4  (L) 08/28/2015   BILITOT 7.0 (H) 08/28/2015   ALKPHOS 79 08/28/2015   AST 114 (H) 08/28/2015   ALT 148 (H) 08/28/2015  .   Total Time in preparing paper work, data evaluation and todays exam - 35 minutes  Verdell Dykman M.D on 08/29/2015 at 12:32 PM  [facility]   Office  [phone]

## 2015-08-29 NOTE — Progress Notes (Signed)
OT Cancellation Note  Patient Details Name: Bradley Harris MRN: [id number] DOB: 1955-12-10   Cancelled Treatment:    Reason Eval/Treat Not Completed: Patient declined, no reason specified. Pt stated, " I politey decline OT today, I'm worried about multiple episodes of BMs that I've been having and I'm leaving today"  Galen Manila 08/29/2015, 11:43 AM

## 2015-08-29 NOTE — Discharge Instructions (Signed)
Follow with Primary MD  in 7 days   Get CBC, CMP,  checked  by Primary MD next visit.    Activity: As tolerated with Full fall precautions use walker/cane & assistance as needed   Disposition Home    Diet: Low-salt, carbohydrate modified, with feeding assistance and aspiration precautions.  For Heart failure patients - Check your Weight same time everyday, if you gain over 2 pounds, or you develop in leg swelling, experience more shortness of breath or chest pain, call your Primary MD immediately. Follow Cardiac Low Salt Diet and 1.5 lit/day fluid restriction.   On your next visit with your primary care physician please Get Medicines reviewed and adjusted.   Please request your Prim.MD to go over all Hospital Tests and Procedure/Radiological results at the follow up, please get all Hospital records sent to your Prim MD by signing hospital release before you go home.   If you experience worsening of your admission symptoms, develop shortness of breath, life threatening emergency, suicidal or homicidal thoughts you must seek medical attention immediately by calling 911 or calling your MD immediately  if symptoms less severe.  You Must read complete instructions/literature along with all the possible adverse reactions/side effects for all the Medicines you take and that have been prescribed to you. Take any new Medicines after you have completely understood and accpet all the possible adverse reactions/side effects.   Do not drive, operating heavy machinery, perform activities at heights, swimming or participation in water activities or provide baby sitting services if your were admitted for syncope or siezures until you have seen by Primary MD or a Neurologist and advised to do so again.  Do not drive when taking Pain medications.    Do not take more than prescribed Pain, Sleep and Anxiety Medications  Special Instructions: If you have smoked or chewed Tobacco  in the last 2 yrs please  stop smoking, stop any regular Alcohol  and or any Recreational drug use.  Wear Seat belts while driving.   Please note  You were cared for by a hospitalist during your hospital stay. If you have any questions about your discharge medications or the care you received while you were in the hospital after you are discharged, you can call the unit and asked to speak with the hospitalist on call if the hospitalist that took care of you is not available. Once you are discharged, your primary care physician will handle any further medical issues. Please note that NO REFILLS for any discharge medications will be authorized once you are discharged, as it is imperative that you return to your primary care physician (or establish a relationship with a primary care physician if you do not have one) for your aftercare needs so that they can reassess your need for medications and monitor your lab values.

## 2015-08-29 NOTE — Progress Notes (Signed)
PT Cancellation Note  Patient Details Name: Bradley Harris MRN: [id number] DOB: 1955/07/26   Cancelled Treatment:    Reason Eval/Treat Not Completed: Patient declined, no reason specified. Patient reports he is going home today and "politely declines" PT session today.  Patient reports he has been walking to the bathroom.  Reports no equipment needs.   Vena Austria 08/29/2015, 2:16 PM Durenda Hurt. Renaldo Fiddler, [facility] Acute Rehab Services Pager [phone]

## 2015-09-02 ENCOUNTER — Emergency Department (HOSPITAL_COMMUNITY)
Admission: EM | Admit: 2015-09-02 | Discharge: 2015-09-02 | Disposition: A | Discharge status: Home / Self Care | Payer: Self-pay | Attending: Emergency Medicine | Admitting: Emergency Medicine

## 2015-09-02 ENCOUNTER — Emergency Department (HOSPITAL_COMMUNITY): Payer: Self-pay

## 2015-09-02 ENCOUNTER — Encounter (HOSPITAL_COMMUNITY): Payer: Self-pay | Admitting: Emergency Medicine

## 2015-09-02 DIAGNOSIS — Z79899 Other long term (current) drug therapy: Secondary | ICD-10-CM | POA: Insufficient documentation

## 2015-09-02 DIAGNOSIS — Y733 Surgical instruments, materials and gastroenterology and urology devices (including sutures) associated with adverse incidents: Secondary | ICD-10-CM | POA: Insufficient documentation

## 2015-09-02 DIAGNOSIS — Z794 Long term (current) use of insulin: Secondary | ICD-10-CM | POA: Insufficient documentation

## 2015-09-02 DIAGNOSIS — E1140 Type 2 diabetes mellitus with diabetic neuropathy, unspecified: Secondary | ICD-10-CM | POA: Insufficient documentation

## 2015-09-02 DIAGNOSIS — R188 Other ascites: Secondary | ICD-10-CM | POA: Insufficient documentation

## 2015-09-02 HISTORY — DX: Unspecified cirrhosis of liver: K74.60

## 2015-09-02 LAB — COMPREHENSIVE METABOLIC PANEL
ALBUMIN: 2.5 g/dL — AB (ref 3.5–5.0)
ALK PHOS: 97 U/L (ref 38–126)
ALT: 96 U/L — AB (ref 17–63)
AST: 120 U/L — ABNORMAL HIGH (ref 15–41)
Anion gap: 5 (ref 5–15)
BUN: <5 mg/dL — ABNORMAL LOW (ref 6–20)
CALCIUM: 8.3 mg/dL — AB (ref 8.9–10.3)
CO2: 24 mmol/L (ref 22–32)
CREATININE: 0.79 mg/dL (ref 0.61–1.24)
Chloride: 108 mmol/L (ref 101–111)
GFR calc non Af Amer: >60 mL/min (ref >60)
GLUCOSE: 127 mg/dL — AB (ref 65–99)
Potassium: 3.8 mmol/L (ref 3.5–5.1)
SODIUM: 137 mmol/L (ref 135–145)
Total Bilirubin: 9.9 mg/dL — ABNORMAL HIGH (ref 0.3–1.2)
Total Protein: 6 g/dL — ABNORMAL LOW (ref 6.5–8.1)

## 2015-09-02 LAB — LIPASE, BLOOD: LIPASE: 64 U/L — AB (ref 11–51)

## 2015-09-02 LAB — CBC
HCT: 27.6 % — ABNORMAL LOW (ref 39.0–52.0)
Hemoglobin: 8.7 g/dL — ABNORMAL LOW (ref 13.0–17.0)
MCH: 27.3 pg (ref 26.0–34.0)
MCHC: 31.5 g/dL (ref 30.0–36.0)
MCV: 86.5 fL (ref 78.0–100.0)
PLATELETS: 126 10*3/uL — AB (ref 150–400)
RBC: 3.19 MIL/uL — ABNORMAL LOW (ref 4.22–5.81)
RDW: 19.2 % — AB (ref 11.5–15.5)
WBC: 4.9 10*3/uL (ref 4.0–10.5)

## 2015-09-02 LAB — PROTIME-INR
INR: 1.44
Prothrombin Time: 17.7 seconds — ABNORMAL HIGH (ref 11.4–15.2)

## 2015-09-02 MED ORDER — LIDOCAINE HCL (PF) 1 % IJ SOLN
INTRAMUSCULAR | Status: AC
  Filled 2015-09-02: qty 10

## 2015-09-02 NOTE — ED Triage Notes (Signed)
Pt. Stated, I had an endoscopy and it was a scrape and had to be repaired, since then I have started swelling lke crazy to the point of having to buy new clothes . I have liver disease . I just got out of hospital 3 days ago.  The swelling is just over whelming.  I have been compliant with eating and meds.

## 2015-09-02 NOTE — ED Notes (Signed)
Pt gone to ultrasound

## 2015-09-02 NOTE — ED Provider Notes (Signed)
[facility]-EMERGENCY DEPT Provider Note   CSN: [id number] Arrival date & time: 09/02/15  0865  First Provider Contact:  None      History   Chief Complaint Chief Complaint  Patient presents with  . Post-op Problem  . Hepatic Disease  . Endoscopy    HPI Bradley Harris is a 60 y.o. male.  The history is provided by the patient. No language interpreter was used.  Abdominal Pain   This is a recurrent problem. The current episode started more than 2 days ago. The problem occurs constantly. The problem has been gradually worsening. Associated with: nothing, constant. The pain is located in the generalized abdominal region. Quality: tightness. The pain is mild. Associated symptoms include flatus and nausea. Pertinent negatives include anorexia, fever, diarrhea, hematochezia, melena, vomiting, dysuria, frequency, hematuria and headaches. The symptoms are aggravated by activity. Nothing relieves the symptoms. Past workup includes GI consult and ultrasound. Past workup does not include CT scan or surgery. His past medical history does not include PUD, gallstones, GERD, ulcerative colitis or Crohn's disease. Past medical history comments: cirrhosis.    Past Medical History:  Diagnosis Date  . Alcoholism (HCC)   . Depression   . Diabetes mellitus without complication (HCC)   . Hypercholesterolemia   . Liver cirrhosis (HCC)   . Neuropathy [facility])     Patient Active Problem List   Diagnosis Date Noted  . Polymicrobial bacterial infection   . Bacteremia   . Mallory-Weiss tear   . Uncontrolled type 2 diabetes mellitus with ketoacidosis without coma, without long-term current use of insulin (HCC)   . Portal hypertension with esophageal varices (HCC) 08/23/2015  . Alcohol intoxication (HCC)   . Elevated liver enzymes   . Bleeding gastrointestinal 08/22/2015  . Elevated lactic acid level 08/22/2015  . Alcoholism (HCC)   . Depression   . Diabetes mellitus without complication (HCC)   .  Hypercholesterolemia   . Neuropathy (HCC)   . Thrombocytopenia (HCC)   . Hemorrhagic shock   . Acute respiratory failure [facility])     Past Surgical History:  Procedure Laterality Date  . ESOPHAGOGASTRODUODENOSCOPY Left 08/22/2015   Procedure: ESOPHAGOGASTRODUODENOSCOPY (EGD);  Surgeon: Charlott Rakes, MD;  Location: [facility] ENDOSCOPY;  Service: Endoscopy;  Laterality: Left;  . Gastric bypass surgery    . LUMBAR SPINE SURGERY    . SHOULDER SURGERY         Home Medications    Prior to Admission medications   Medication Sig Start Date End Date Taking? Authorizing Provider  amoxicillin-clavulanate (AUGMENTIN) 875-125 MG tablet Take 1 tablet by mouth 2 (two) times daily. 08/29/15  Yes Starleen Arms, MD  busPIRone (BUSPAR) 5 MG tablet Take 5 mg by mouth 2 (two) times daily.   Yes Historical Provider, MD  citalopram (CELEXA) 20 MG tablet Take 20 mg by mouth at bedtime.    Yes Historical Provider, MD  folic acid (FOLVITE) 1 MG tablet Take 1 tablet (1 mg total) by mouth daily. 08/29/15  Yes Starleen Arms, MD  gabapentin (NEURONTIN) 300 MG capsule Take 300 mg by mouth 3 (three) times daily.   Yes Historical Provider, MD  insulin aspart protamine- aspart (NOVOLOG MIX 70/30) (70-30) 100 UNIT/ML injection Inject 10-16 Units into the skin 2 (two) times daily with a meal. Use 16 units every morning and use 10 units every night   Yes Historical Provider, MD  Naphazoline HCl (CLEAR EYES OP) Place 1 drop into both eyes as needed (for dry eyes).   Yes  Historical Provider, MD  pantoprazole (PROTONIX) 40 MG tablet Take 1 tablet (40 mg total) by mouth 2 (two) times daily. 08/29/15  Yes Starleen Armsawood S Elgergawy, MD  Probiotic Product (PROBIOTIC PO) Take 1 capsule by mouth daily.   Yes Historical Provider, MD  simvastatin (ZOCOR) 20 MG tablet Take 20 mg by mouth daily.   Yes Historical Provider, MD  thiamine 100 MG tablet Take 1 tablet (100 mg total) by mouth daily. 08/29/15  Yes Leana Roeawood S Elgergawy, MD  insulin  aspart (NOVOLOG) 100 UNIT/ML injection Inject 1-10 Units into the skin daily as needed for high blood sugar.    Historical Provider, MD  saccharomyces boulardii (FLORASTOR) 250 MG capsule Take 1 capsule (250 mg total) by mouth 2 (two) times daily. Patient not taking: Reported on 09/02/2015 08/29/15   Starleen Armsawood S Elgergawy, MD    Family History Family History  Problem Relation Age of Onset  . Diabetes Mellitus I Mother   . Dementia Father     Social History Social History  Substance Use Topics  . Smoking status: Never Smoker  . Smokeless tobacco: Never Used  . Alcohol use Yes     Allergies   Tape   Review of Systems Review of Systems  Constitutional: Negative for chills, diaphoresis, fatigue and fever.  HENT: Negative.   Eyes:       Scleral icterus  Respiratory: Positive for shortness of breath (worse with increasing distention). Negative for cough.   Cardiovascular: Negative for chest pain.  Gastrointestinal: Positive for abdominal pain, flatus and nausea. Negative for anorexia, blood in stool, diarrhea, hematochezia, melena and vomiting.  Genitourinary: Negative for dysuria, frequency and hematuria.  Musculoskeletal: Positive for back pain (soreness with increased abd weight).  Skin: Positive for color change (icteric throughout).  Neurological: Negative for syncope and headaches.  Hematological: Bruises/bleeds easily.  Psychiatric/Behavioral: Negative.      Physical Exam Updated Vital Signs BP 97/61   Pulse 79   Temp 98.3 F (36.8 C)   Resp 18   Ht 6\' 4"  (1.93 m)   Wt 113 kg   SpO2 100%   BMI 30.32 kg/m   Physical Exam  Constitutional: He is oriented to person, place, and time. He appears well-developed and well-nourished. He appears distressed (mild).  HENT:  Head: Normocephalic and atraumatic.  Mouth/Throat: Oropharynx is clear and moist.  Eyes: Conjunctivae are normal. Right eye exhibits no discharge. Left eye exhibits no discharge. Scleral icterus is  present.  Neck: Normal range of motion. Neck supple. No tracheal deviation present.  Cardiovascular: Normal rate and regular rhythm.   No murmur heard. Pulmonary/Chest: Effort normal and breath sounds normal. No stridor. No respiratory distress. He has no wheezes. He has no rales.  Abdominal: Soft. He exhibits distension. There is no tenderness. There is no rebound and no guarding.  Uncomfortable 2/2 tightness, but no significant TTP  Musculoskeletal: He exhibits edema (bilat pitting edema in LE). He exhibits no tenderness or deformity.  Neurological: He is alert and oriented to person, place, and time. GCS eye subscore is 4. GCS verbal subscore is 5. GCS motor subscore is 6.  Skin: Skin is warm and dry. Capillary refill takes less than 2 seconds. He is not diaphoretic.  Icteric  Psychiatric: He has a normal mood and affect. His behavior is normal. Judgment and thought content normal.  Nursing note and vitals reviewed.    ED Treatments / Results  Labs (all labs ordered are listed, but only abnormal results are displayed) Labs Reviewed  COMPREHENSIVE METABOLIC PANEL - Abnormal; Notable for the following:       Result Value   Glucose, Bld 127 (*)    BUN <5 (*)    Calcium 8.3 (*)    Total Protein 6.0 (*)    Albumin 2.5 (*)    AST 120 (*)    ALT 96 (*)    Total Bilirubin 9.9 (*)    All other components within normal limits  CBC - Abnormal; Notable for the following:    RBC 3.19 (*)    Hemoglobin 8.7 (*)    HCT 27.6 (*)    RDW 19.2 (*)    Platelets 126 (*)    All other components within normal limits  LIPASE, BLOOD - Abnormal; Notable for the following:    Lipase 64 (*)    All other components within normal limits  PROTIME-INR - Abnormal; Notable for the following:    Prothrombin Time 17.7 (*)    All other components within normal limits    EKG  EKG Interpretation None       Radiology No results found.  Procedures Procedures (including critical care  time)  Medications Ordered in ED Medications - No data to display   Initial Impression / Assessment and Plan / ED Course  I have reviewed the triage vital signs and the nursing notes.  Pertinent labs & imaging results that were available during my care of the patient were reviewed by me and considered in my medical decision making (see chart for details).  Clinical Course   60 year old male with recent admission for GI bleed who presents 3 days after discharge due to worsening ascites and generalized edema. Patient states he been compliant with medications however was not discharged with a diuretic. Patient does have history of occasional therapeutic paracenteses. Low concern for SBP at this time since patient is afebrile and abdominal exam is reassuring. Patient is likely having ascites secondary to portal hypertension and cirrhosis. No history of cardiac disease. Will send basic blood work. No significant change from prior draws last week. Sent for therapeutic tap. Removed 3 L without difficulty. Patient tolerated procedure well. Reports feeling greatly improved on reassessment. Encouraged patient to discuss establishing care with GI doctor [place] in [place] he is from. Patient stated understanding and agreement. All questions answered. Discussed results with patient. Patient and VS stable at discharge. Usual and customary return precautions for ascites were given.   Final Clinical Impressions(s) / ED Diagnoses   Final diagnoses:  Ascites    New Prescriptions Discharge Medication List as of 09/02/2015  3:53 PM       Maretta Bees, MD 09/05/15 1901    Cathren Laine, MD 09/06/15 [phone]

## 2015-09-02 NOTE — Procedures (Signed)
Successful US guided paracentesis from left lateral abdomen.  Yielded 3 liters of clear yellow fluid.  No immediate complications.  Pt tolerated well.   Murlene Revell S Nan Maya PA-C 09/02/2015 2:43 PM

## 2016-12-04 IMAGING — CR DG CHEST 1V PORT
1 series · 1 of 1 positions shown · non-contrast
Comparison: One-view chest 08/22/2015

CLINICAL DATA: Fluid overload.

EXAM:
PORTABLE CHEST 1 VIEW

[AP]
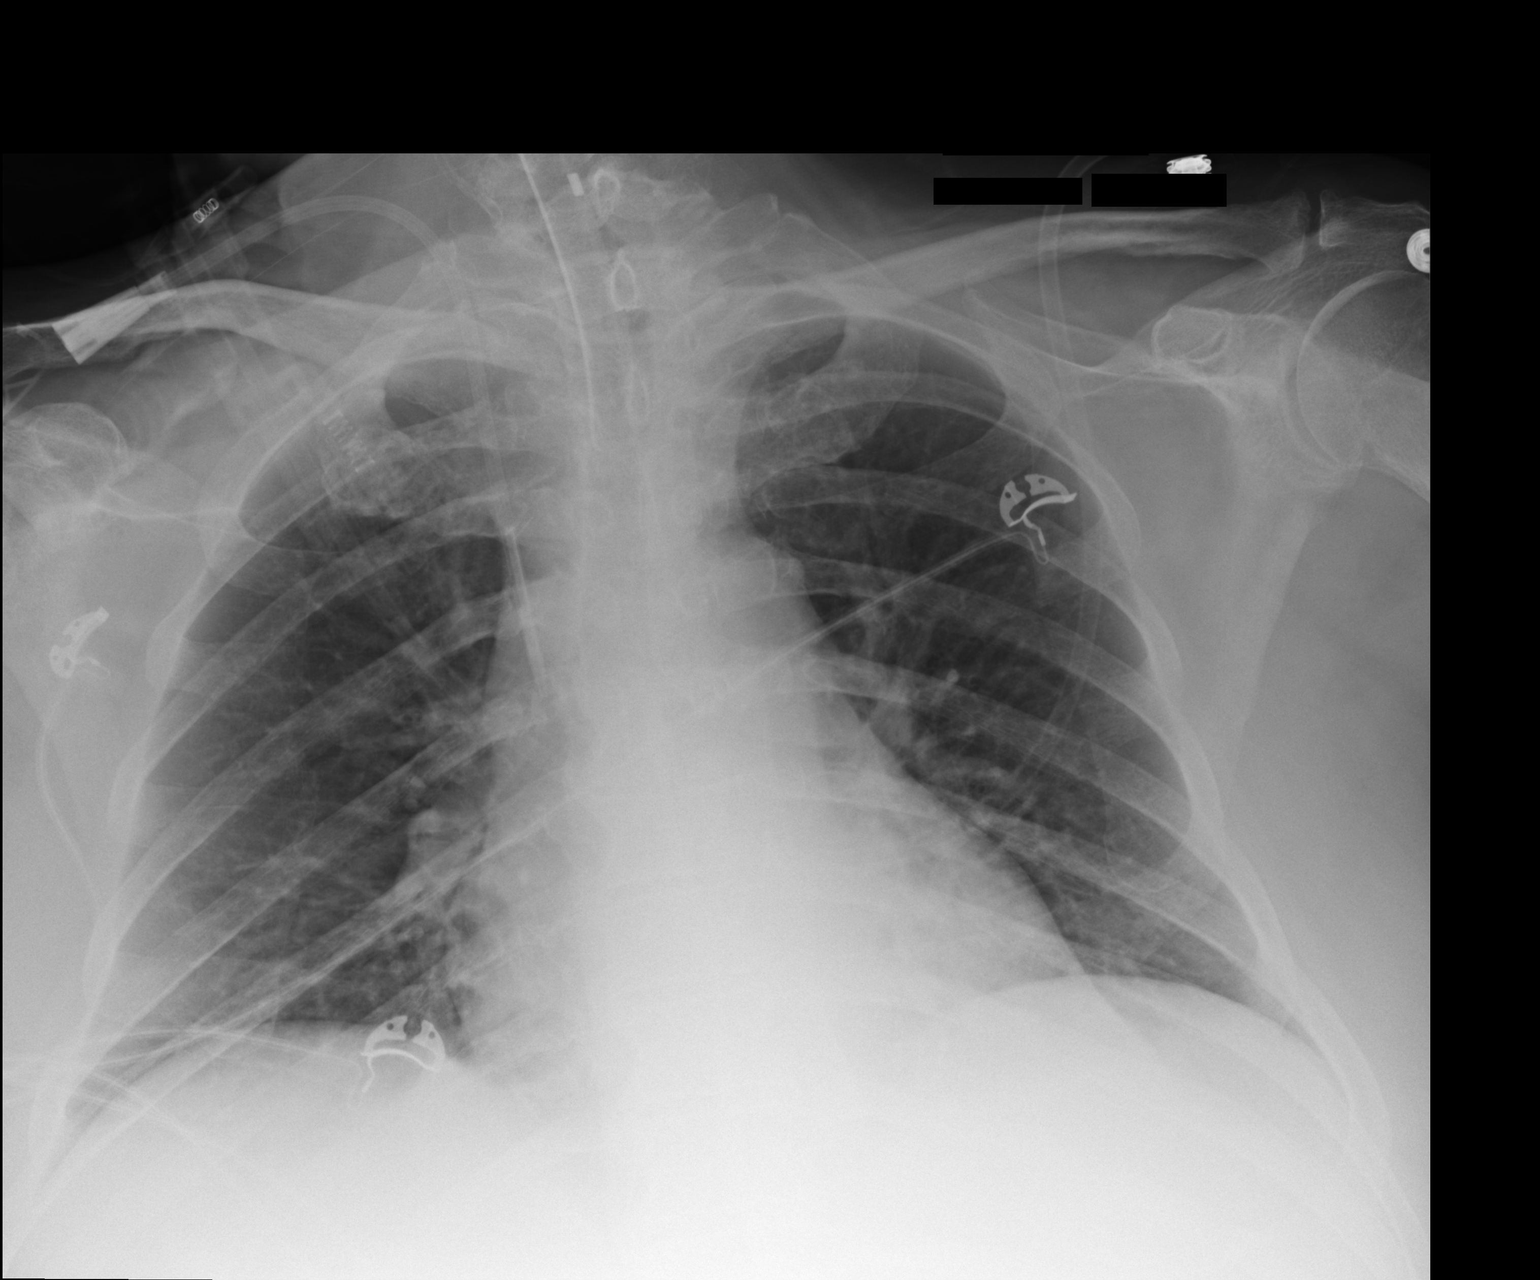

[1 of 1 positions shown; findings below may reference images not displayed]

FINDINGS: The heart size is exaggerated by low lung volumes. Endotracheal tube
and right IJ line are stable. Aeration is slightly improved.
IMPRESSION: Improving aeration with persistent low lung volumes.

## 2016-12-05 IMAGING — CR DG CHEST 1V PORT
1 series · 1 of 1 positions shown · non-contrast
Comparison: 08/23/2015

CLINICAL DATA: Fluid overload

EXAM:
PORTABLE CHEST 1 VIEW

[AP]
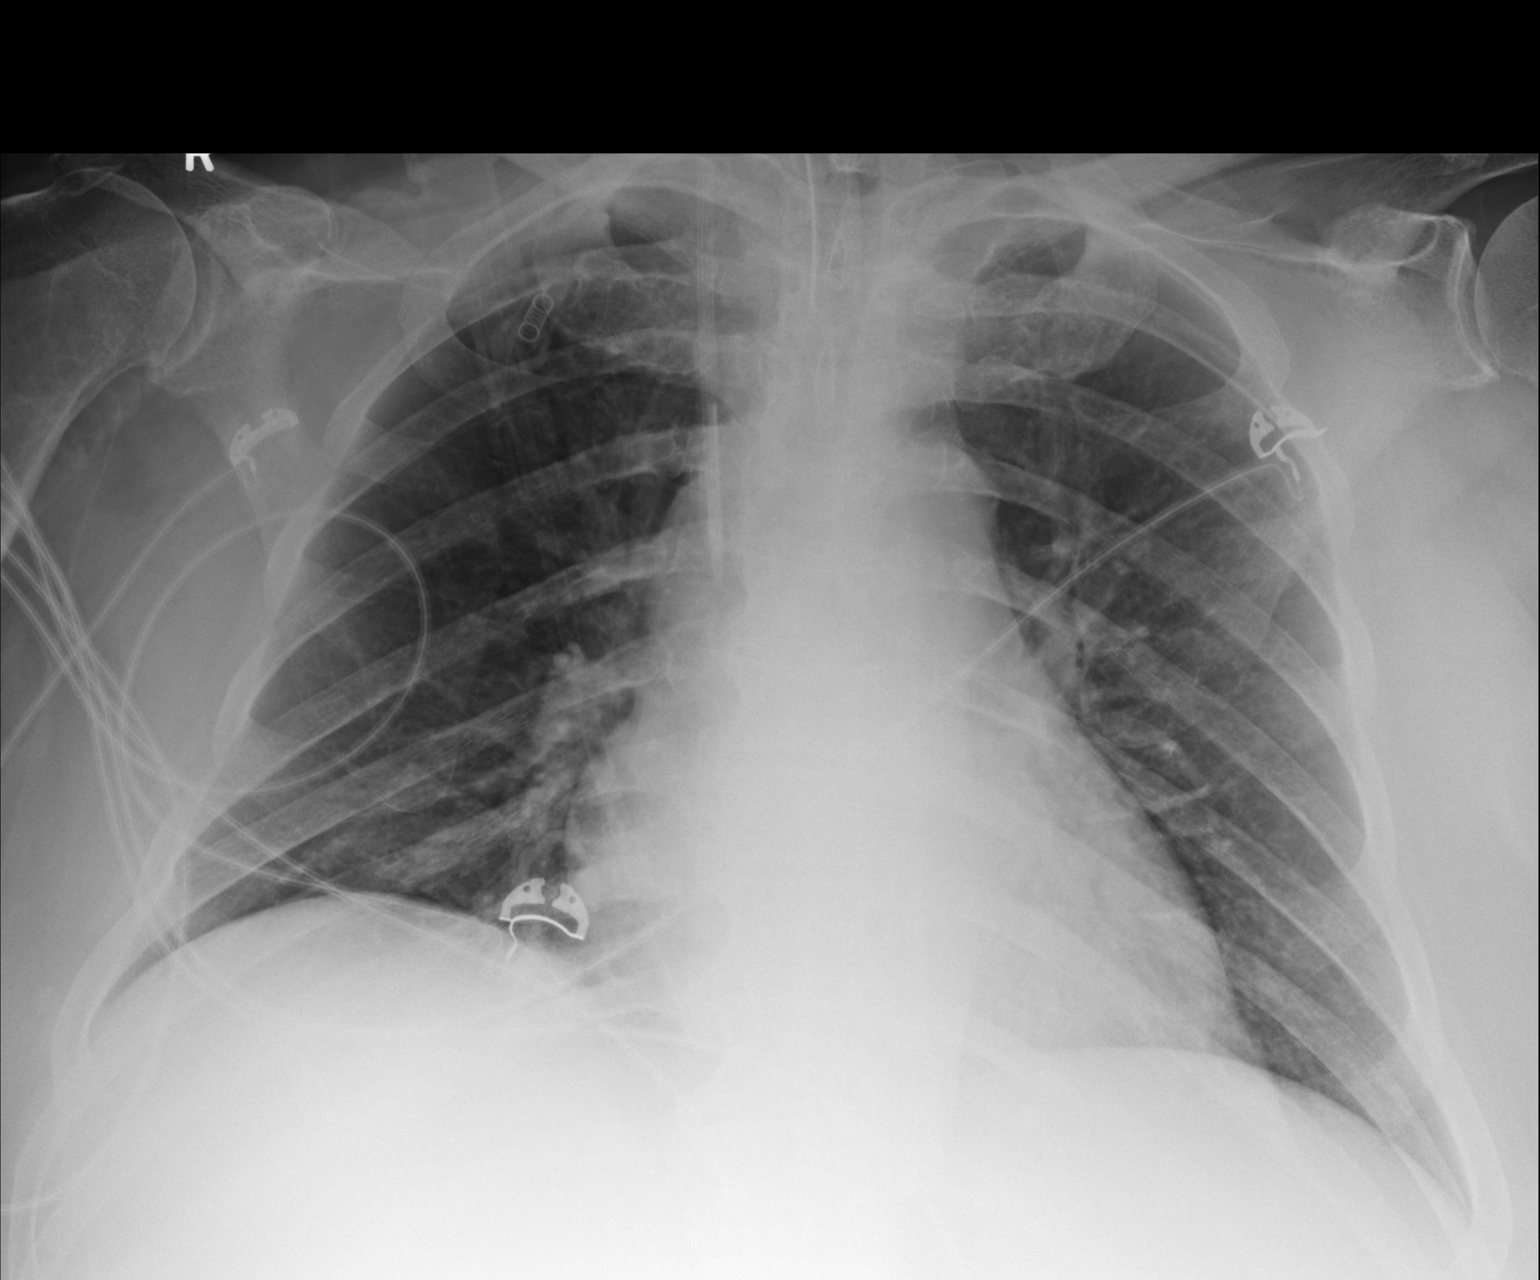

[1 of 1 positions shown; findings below may reference images not displayed]

FINDINGS: Endotracheal tube and right dialysis catheter are unchanged.
Improving aeration. No confluent airspace opacities, effusions or
edema. Heart is normal size. No acute bony abnormality.
IMPRESSION: Stable support devices.

No acute findings.

## 2017-06-21 IMAGING — US US PARACENTESIS
1 series · 5 of 5 positions shown · non-contrast
Comparison: none

INDICATION: Ascites secondary to alcoholic cirrhosis. Request for therapeutic
paracentesis.

[Series 1: us paracentesis · 0.26mm/px · 5 of 5 slices shown]
[im 1/5]
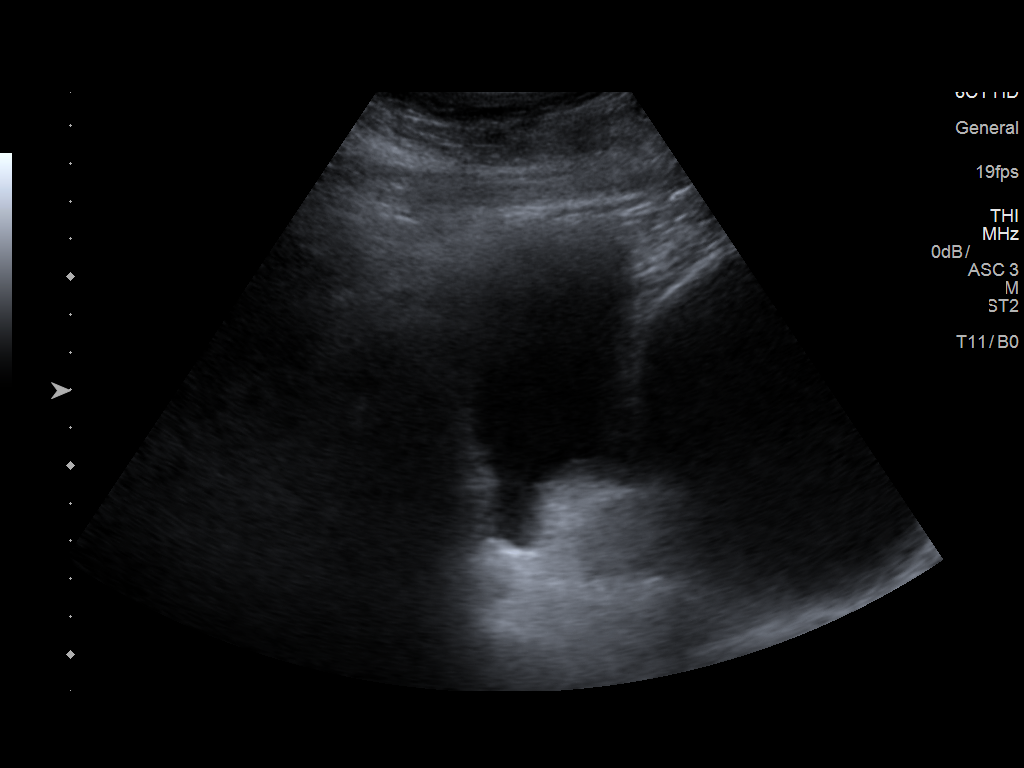
[im 2/5]
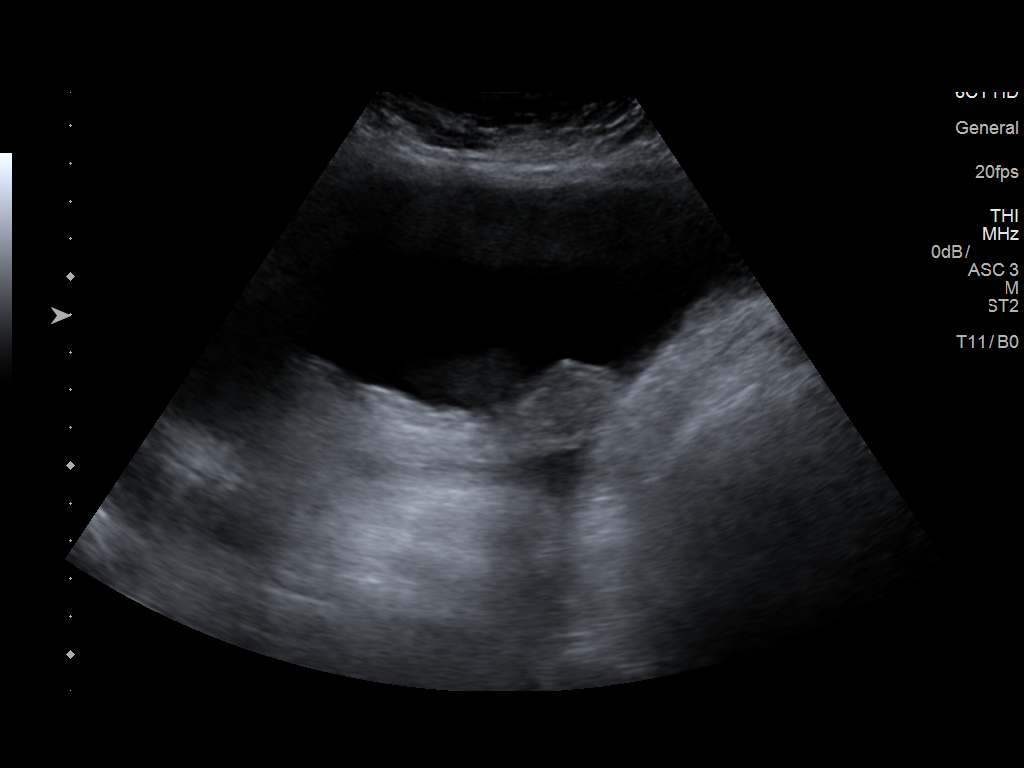
[im 3/5]
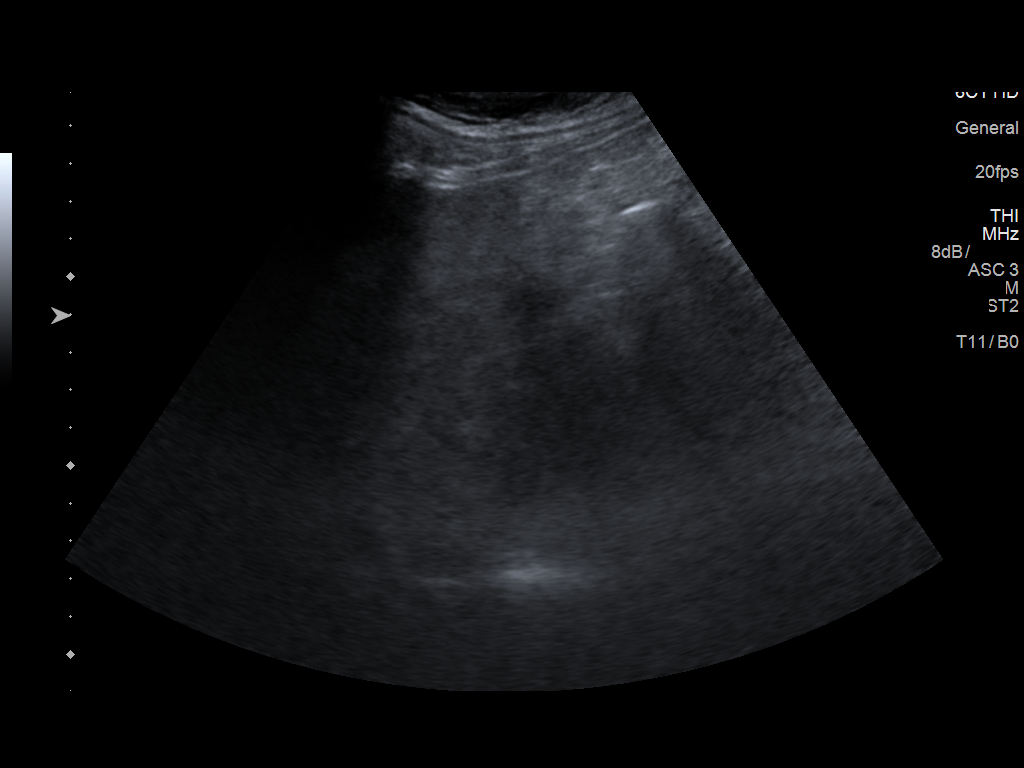
[im 4/5]
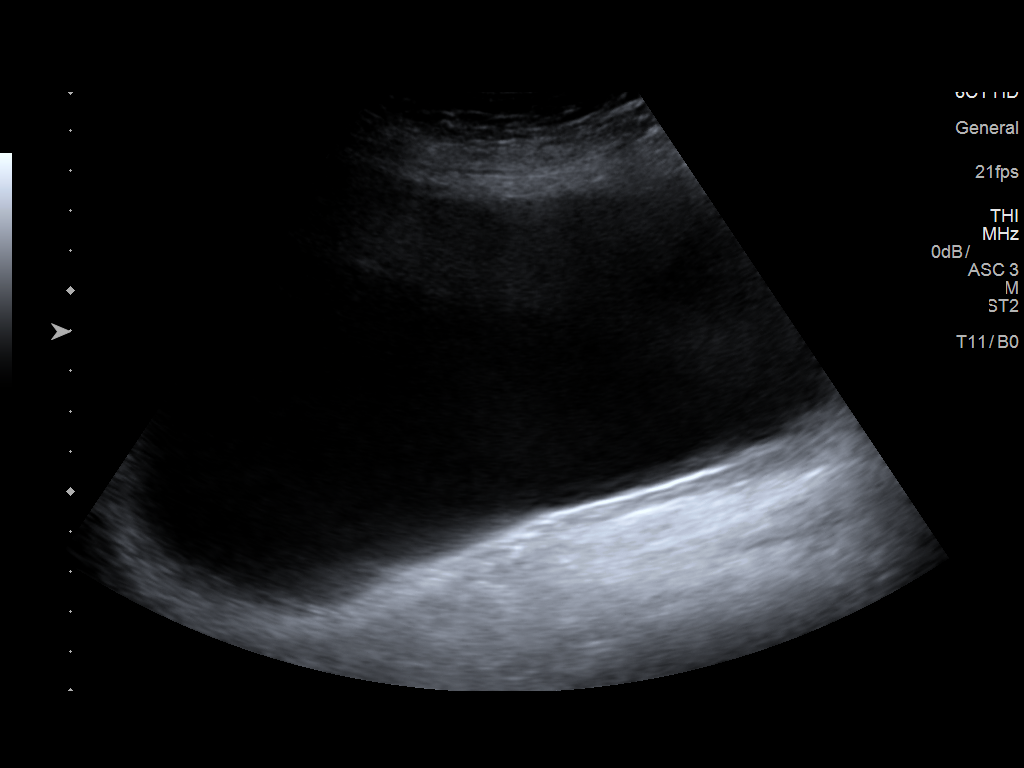
[im 5/5]
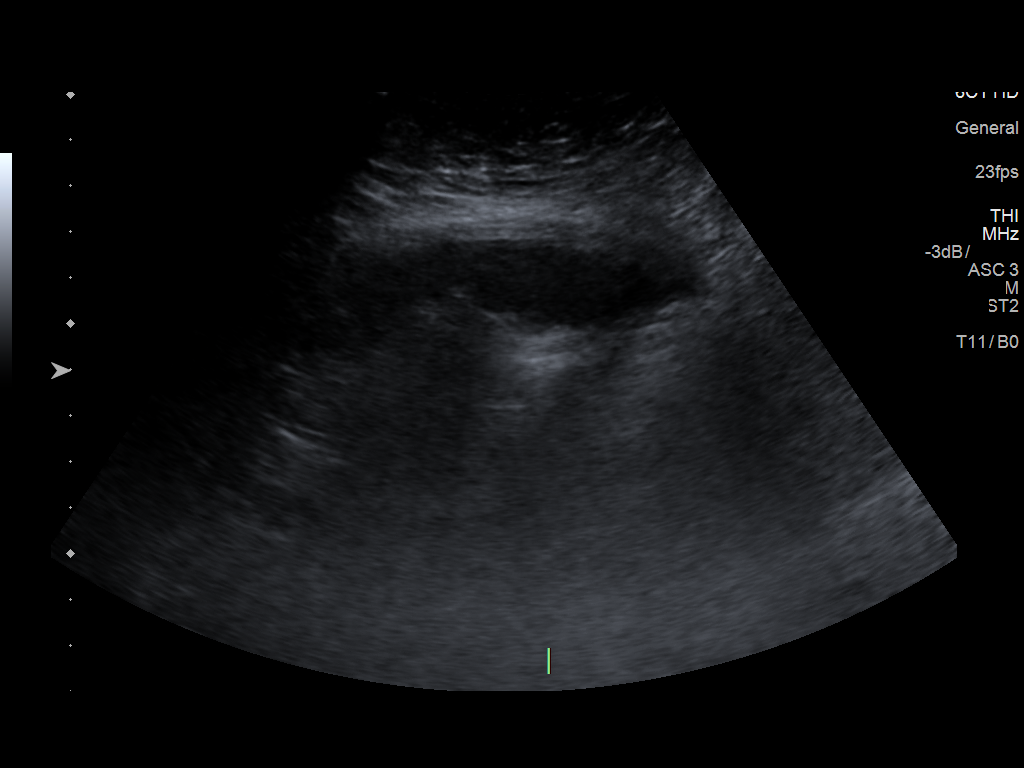

[5 of 5 positions shown; findings below may reference images not displayed]

EXAM:
ULTRASOUND GUIDED LEFT LATERAL ABDOMEN PARACENTESIS

MEDICATIONS:
1% Lidocaine.

COMPLICATIONS:
None immediate.

PROCEDURE:
Informed written consent was obtained from the patient after a
discussion of the risks, benefits and alternatives to treatment. A
timeout was performed prior to the initiation of the procedure.

Initial ultrasound scanning demonstrates a large amount of ascites
within the left lateral abdomen. The left lateral abdomen was
prepped and draped in the usual sterile fashion. 1% lidocaine with
epinephrine was used for local anesthesia.

Following this, a 19 gauge, 7-cm, Yueh catheter was introduced. An
ultrasound image was saved for documentation purposes. The
paracentesis was performed. The catheter was removed and a dressing
was applied. The patient tolerated the procedure well without
immediate post procedural complication.
FINDINGS: A total of approximately 3 Liters of clear yellow fluid was removed.
IMPRESSION: Successful ultrasound-guided paracentesis yielding 3 liters of
peritoneal fluid.

## 2020-09-25 DEATH — deceased
